# Patient Record
Sex: Female | Born: 1960 | Race: White | Hispanic: No | Marital: Married | Smoking: Former smoker
Health system: Southern US, Community
[De-identification: ages and names within clinical notes are randomized; demographics above are authoritative.]

## PROBLEM LIST (undated history)

## (undated) DIAGNOSIS — F419 Anxiety disorder, unspecified: Secondary | ICD-10-CM

## (undated) DIAGNOSIS — Z9889 Other specified postprocedural states: Secondary | ICD-10-CM

## (undated) DIAGNOSIS — R61 Generalized hyperhidrosis: Secondary | ICD-10-CM

## (undated) DIAGNOSIS — K559 Vascular disorder of intestine, unspecified: Secondary | ICD-10-CM

## (undated) DIAGNOSIS — K219 Gastro-esophageal reflux disease without esophagitis: Secondary | ICD-10-CM

## (undated) DIAGNOSIS — Z973 Presence of spectacles and contact lenses: Secondary | ICD-10-CM

## (undated) DIAGNOSIS — F32A Depression, unspecified: Secondary | ICD-10-CM

## (undated) DIAGNOSIS — R232 Flushing: Secondary | ICD-10-CM

## (undated) DIAGNOSIS — K529 Noninfective gastroenteritis and colitis, unspecified: Secondary | ICD-10-CM

## (undated) DIAGNOSIS — I10 Essential (primary) hypertension: Secondary | ICD-10-CM

## (undated) DIAGNOSIS — C50919 Malignant neoplasm of unspecified site of unspecified female breast: Secondary | ICD-10-CM

## (undated) DIAGNOSIS — K146 Glossodynia: Secondary | ICD-10-CM

## (undated) DIAGNOSIS — F329 Major depressive disorder, single episode, unspecified: Secondary | ICD-10-CM

## (undated) HISTORY — DX: Flushing: R23.2

## (undated) HISTORY — DX: Other specified postprocedural states: Z98.890

## (undated) HISTORY — DX: Malignant neoplasm of unspecified site of unspecified female breast: C50.919

## (undated) HISTORY — PX: COLON SURGERY: SHX602

## (undated) HISTORY — DX: Generalized hyperhidrosis: R61

## (undated) HISTORY — DX: Presence of spectacles and contact lenses: Z97.3

## (undated) HISTORY — PX: DIAGNOSTIC LAPAROSCOPY: SUR761

## (undated) HISTORY — DX: Depression, unspecified: F32.A

## (undated) HISTORY — PX: APPENDECTOMY: SHX54

## (undated) HISTORY — DX: Major depressive disorder, single episode, unspecified: F32.9

---

## 1998-07-25 ENCOUNTER — Encounter: Admission: RE | Admit: 1998-07-25 | Discharge: 1998-10-23 | Payer: Self-pay | Admitting: Family Medicine

## 1999-11-24 HISTORY — PX: BRAIN SURGERY: SHX531

## 2003-09-12 ENCOUNTER — Ambulatory Visit (HOSPITAL_COMMUNITY): Admission: RE | Admit: 2003-09-12 | Discharge: 2003-09-12 | Payer: Self-pay | Admitting: Gastroenterology

## 2007-06-24 ENCOUNTER — Emergency Department (HOSPITAL_COMMUNITY): Admission: EM | Admit: 2007-06-24 | Discharge: 2007-06-25 | Payer: Self-pay | Admitting: Emergency Medicine

## 2007-11-23 ENCOUNTER — Emergency Department (HOSPITAL_COMMUNITY): Admission: EM | Admit: 2007-11-23 | Discharge: 2007-11-23 | Payer: Self-pay | Admitting: Emergency Medicine

## 2007-11-24 HISTORY — PX: ABDOMINAL HYSTERECTOMY: SHX81

## 2009-01-21 DEATH — deceased

## 2009-03-27 ENCOUNTER — Ambulatory Visit (HOSPITAL_COMMUNITY): Admission: RE | Admit: 2009-03-27 | Discharge: 2009-03-27 | Payer: Self-pay | Admitting: Family Medicine

## 2010-02-24 ENCOUNTER — Emergency Department (HOSPITAL_COMMUNITY): Admission: EM | Admit: 2010-02-24 | Discharge: 2010-02-24 | Payer: Self-pay | Admitting: Emergency Medicine

## 2010-03-06 ENCOUNTER — Emergency Department (HOSPITAL_COMMUNITY): Admission: EM | Admit: 2010-03-06 | Discharge: 2010-03-06 | Payer: Self-pay | Admitting: Emergency Medicine

## 2010-08-08 ENCOUNTER — Ambulatory Visit (HOSPITAL_COMMUNITY)
Admission: RE | Admit: 2010-08-08 | Discharge: 2010-08-08 | Payer: Self-pay | Source: Home / Self Care | Admitting: Family Medicine

## 2010-11-23 DIAGNOSIS — Z9889 Other specified postprocedural states: Secondary | ICD-10-CM

## 2010-11-23 HISTORY — PX: CHOLECYSTECTOMY: SHX55

## 2010-11-23 HISTORY — DX: Other specified postprocedural states: Z98.890

## 2010-12-02 ENCOUNTER — Ambulatory Visit (HOSPITAL_COMMUNITY): Admission: RE | Admit: 2010-12-02 | Payer: Self-pay | Source: Home / Self Care | Admitting: Family Medicine

## 2010-12-14 ENCOUNTER — Encounter: Payer: Self-pay | Admitting: Obstetrics and Gynecology

## 2010-12-24 ENCOUNTER — Encounter: Payer: Self-pay | Admitting: Family Medicine

## 2011-02-02 ENCOUNTER — Inpatient Hospital Stay (HOSPITAL_COMMUNITY)
Admission: RE | Admit: 2011-02-02 | Discharge: 2011-02-05 | DRG: 395 | Disposition: A | Discharge status: Home / Self Care | Payer: Medicare (Managed Care) | Source: Ambulatory Visit | Attending: Family Medicine | Admitting: Family Medicine

## 2011-02-02 ENCOUNTER — Emergency Department (HOSPITAL_COMMUNITY): Payer: Medicare (Managed Care)

## 2011-02-02 DIAGNOSIS — K559 Vascular disorder of intestine, unspecified: Principal | ICD-10-CM | POA: Diagnosis present

## 2011-02-02 DIAGNOSIS — K5730 Diverticulosis of large intestine without perforation or abscess without bleeding: Secondary | ICD-10-CM | POA: Diagnosis present

## 2011-02-02 DIAGNOSIS — K146 Glossodynia: Secondary | ICD-10-CM | POA: Diagnosis present

## 2011-02-02 DIAGNOSIS — I10 Essential (primary) hypertension: Secondary | ICD-10-CM | POA: Diagnosis present

## 2011-02-02 DIAGNOSIS — Z8673 Personal history of transient ischemic attack (TIA), and cerebral infarction without residual deficits: Secondary | ICD-10-CM

## 2011-02-02 LAB — BASIC METABOLIC PANEL
BUN: 12 mg/dL (ref 6–23)
Calcium: 8.9 mg/dL (ref 8.4–10.5)
GFR calc non Af Amer: >60 mL/min (ref >60)
Glucose, Bld: 96 mg/dL (ref 70–99)
Sodium: 140 mEq/L (ref 135–145)

## 2011-02-02 LAB — CBC
MCV: 94.1 fL (ref 78.0–100.0)
Platelets: 235 10*3/uL (ref 150–400)
RBC: 4.24 MIL/uL (ref 3.87–5.11)
RDW: 13.3 % (ref 11.5–15.5)
WBC: 9.9 10*3/uL (ref 4.0–10.5)

## 2011-02-02 LAB — URINALYSIS, ROUTINE W REFLEX MICROSCOPIC
Bilirubin Urine: NEGATIVE
Ketones, ur: NEGATIVE mg/dL
Nitrite: NEGATIVE
Specific Gravity, Urine: >1.03 — ABNORMAL HIGH (ref 1.005–1.030)
Urobilinogen, UA: 0.2 mg/dL (ref 0.0–1.0)
pH: 5 (ref 5.0–8.0)

## 2011-02-02 LAB — HEPATIC FUNCTION PANEL
Bilirubin, Direct: 0.1 mg/dL (ref 0.0–0.3)
Indirect Bilirubin: 0.5 mg/dL (ref 0.3–0.9)
Total Protein: 6.1 g/dL (ref 6.0–8.3)

## 2011-02-02 LAB — DIFFERENTIAL
Basophils Relative: 0 % (ref 0–1)
Eosinophils Absolute: 0.2 10*3/uL (ref 0.0–0.7)
Eosinophils Relative: 2 % (ref 0–5)
Lymphs Abs: 2.2 10*3/uL (ref 0.7–4.0)
Neutrophils Relative %: 66 % (ref 43–77)

## 2011-02-03 ENCOUNTER — Inpatient Hospital Stay (HOSPITAL_COMMUNITY): Payer: Medicare (Managed Care)

## 2011-02-03 DIAGNOSIS — R112 Nausea with vomiting, unspecified: Secondary | ICD-10-CM

## 2011-02-03 DIAGNOSIS — K559 Vascular disorder of intestine, unspecified: Secondary | ICD-10-CM

## 2011-02-03 DIAGNOSIS — R109 Unspecified abdominal pain: Secondary | ICD-10-CM

## 2011-02-03 LAB — SEDIMENTATION RATE: Sed Rate: 15 mm/hr (ref 0–22)

## 2011-02-03 MED ORDER — IOHEXOL 350 MG/ML SOLN
100.0000 mL | Freq: Once | INTRAVENOUS | Status: AC | PRN
  Administered 2011-02-03: 100 mL via INTRAVENOUS

## 2011-02-04 LAB — HOMOCYSTEINE: Homocysteine: 6.7 umol/L (ref 4.0–15.4)

## 2011-02-04 LAB — LUPUS ANTICOAGULANT PANEL
DRVVT: 39.8 secs (ref 36.2–44.3)
Lupus Anticoagulant: NOT DETECTED

## 2011-02-04 LAB — CLOSTRIDIUM DIFFICILE BY PCR: Toxigenic C. Difficile by PCR: NEGATIVE

## 2011-02-05 LAB — OVA AND PARASITE EXAMINATION: Ova and parasites: NONE SEEN

## 2011-02-05 LAB — URINE CULTURE

## 2011-02-05 LAB — BETA-2-GLYCOPROTEIN I ABS, IGG/M/A
Beta-2 Glyco I IgG: 0 G Units (ref <20)
Beta-2-Glycoprotein I IgM: 10 M Units (ref <20)

## 2011-02-05 LAB — CBC
MCV: 92.8 fL (ref 78.0–100.0)
Platelets: 226 10*3/uL (ref 150–400)
RDW: 12.8 % (ref 11.5–15.5)
WBC: 5.3 10*3/uL (ref 4.0–10.5)

## 2011-02-05 LAB — CARDIOLIPIN ANTIBODIES, IGG, IGM, IGA: Anticardiolipin IgM: 4 MPL U/mL — ABNORMAL LOW (ref <11)

## 2011-02-05 LAB — DIFFERENTIAL
Basophils Absolute: 0 10*3/uL (ref 0.0–0.1)
Basophils Relative: 0 % (ref 0–1)
Eosinophils Absolute: 0.2 10*3/uL (ref 0.0–0.7)
Eosinophils Relative: 5 % (ref 0–5)

## 2011-02-05 LAB — ANA: Anti Nuclear Antibody(ANA): NEGATIVE

## 2011-02-05 NOTE — Progress Notes (Signed)
  NAME:  AZARIYAH, LUHRS NO.:  [id number]  MEDICAL RECORD NO.:  [id number]          PATIENT TYPE:  LOCATION:                                 FACILITY:  PHYSICIAN:  Kyannah Climer G. Renard Matter, MD        DATE OF BIRTH:  DATE OF PROCEDURE:  02/03/2011 DATE OF DISCHARGE:                                PROGRESS NOTE   This patient was admitted with lower abdominal type pain and rectal bleeding, which occurred within a 24-hour period prior to admission. Does have a history of ischemic colitis.  OBJECTIVE:  VITAL SIGNS:  Blood pressure 98/58, respirations 18, pulse 60, temperature 97.3. LUNGS:  Clear to P and A. HEART:  Regular rhythm. ABDOMEN:  Slight tenderness over lower abdomen.  ASSESSMENT:  The patient was admitted with bouts of bloody diarrhea. Does have a history of ischemic colitis.  Plan to continue current regimen.  Continue to monitor hemoglobin/hematocrit.  We will obtain GI consult.     Tiffine Henigan G. Renard Matter, MD     AGM/MEDQ  D:  02/03/2011  T:  02/03/2011  Job:  [id number]  Electronically Signed by Butch Penny MD on 02/05/2011 04:45:31 PM

## 2011-02-05 NOTE — H&P (Signed)
  NAME:  Angela Boyer, Angela Boyer NO.:  [id number]  MEDICAL RECORD NO.:  [id number]           PATIENT TYPE:  LOCATION:                                 FACILITY:  PHYSICIAN:  Brianna Bennett G. Renard Matter, MD   DATE OF BIRTH:  [birth year]  DATE OF ADMISSION: DATE OF DISCHARGE:  LH                             HISTORY & PHYSICAL   This 50 year old white female was admitted through the emergency department with a chief complaint of having lower abdominal pain which began at approximately 3 a.m. on day of admission.  Apparently, she has a history of ischemic colitis and previous episodes of abdominal pain. She had taken Amitiza, dicyclomine, had several bloody stools and abdominal pain.  She was seen and evaluated by ED physician.  A CT of the abdomen was obtained which showed diffuse wall thickening involving the descending and proximal sigmoid colon compatible with colitis and/or ischemic colitis, did have mild diverticulosis in distal sigmoid colon. The patient's laboratory data, WBC 9900 with hemoglobin 13.5, hematocrit 39.9.  BMET was essentially within normal range as well as liver function.  The patient was subsequently admitted for further care.  SOCIAL HISTORY:  The patient does not smoke or drink alcohol or use drugs.  FAMILY HISTORY:  Hypertension.  PAST MEDICAL HISTORY: 1. Prior history of hypertension. 2. Cerebral aneurysm. 3. Ischemic colitis. 4. Burning tongue syndrome.  SURGICAL HISTORY: 1. Appendectomy. 2. Hysterectomy.  ALLERGIES:  VICODIN.  MEDICATION LIST: 1. Amitiza 24 mcg p.r.n. 2. Dicyclomine 10 mg p.r.n. 3. Omeprazole 40 mg b.i.d. 4. Clonazepam 0.5 mg b.i.d. 5. Ativan 0.5 mg p.r.n. 6. Amlodipine 10 mg daily. 7. Benazepril 20 mg daily. 8. Percocet p.r.n. 9. Gabapentin 600 mg 2 tablets 3 times a day.  REVIEW OF SYSTEMS:  HEENT: Negative.  CARDIOPULMONARY:  No cough, hemoptysis, dyspnea.  GI:  Episodes of abdominal pain and blood in stool.  GU:  No  dysuria or hematuria.  PHYSICAL EXAMINATION:  GENERAL:  Alert white female. VITAL SIGNS:  Blood pressure is 98/58, respirations 18, pulse 60, temperature 97.3. HEENT:  Eyes are PERRLA.  TM negative.  Oropharynx benign. NECK:  Supple.  No JVD or thyroid abnormalities. HEART:  Regular rhythm.  No murmurs. LUNGS:  Clear to P and A. ABDOMEN:  No palpable organs or masses.  The patient is tender over the left lower and right lower quadrants. NEUROLOGIC:  No focal deficit.  ASSESSMENT:  The patient was admitted with lower abdominal pain and rectal bleeding.  She does have a prior history of ischemic colitis, hypertension, burning tongue syndrome.  Dictation ended at this point.     Sakia Schrimpf G. Renard Matter, MD     AGM/MEDQ  D:  02/03/2011  T:  02/03/2011  Job:  [id number]  Electronically Signed by Butch Penny MD on 02/05/2011 04:45:38 PM

## 2011-02-05 NOTE — Progress Notes (Signed)
  NAME:  Angela Boyer, Angela Boyer NO.:  [id number]  MEDICAL RECORD NO.:  [id number]          PATIENT TYPE:  LOCATION:                                 FACILITY:  PHYSICIAN:  Tyrena Gohr G. Renard Matter, MD        DATE OF BIRTH:  DATE OF PROCEDURE: DATE OF DISCHARGE:                                PROGRESS NOTE   This patient was admitted with the chief complaint being lower abdominal pain, which began early on the day of admission.  She does have a history of ischemic colitis in the past.  CT of the abdomen showed diffuse wall thickening involving descending, proximal sigmoid colon. CT angio showed minimal aortic atherosclerosis, patent mesenteric and renal vasculature, colitis left colon and proximal sigmoid colon.  OBJECTIVE:  VITAL SIGNS:  Blood pressure 95/61, respirations 20, pulse 62, temperature 98.1. LUNGS:  Clear to P and A. HEART:  Regular rhythm. ABDOMEN:  The patient is tender over lower abdomen.  ASSESSMENT:  The patient was admitted with abdominal pain and rectal bleeding.  She does have a prior history of ischemic colitis and this appears to be similar episode.  She does have hypertension and burning tongue syndrome.  The patient is being seen by Gastroenterology Service. Various studies have been ordered.  Routine stool studies, hypercoagulable panel ordered.  Continue current regimen.     [name] G. Renard Matter, MD     AGM/MEDQ  D:  02/04/2011  T:  02/04/2011  Job:  [id number]  Electronically Signed by Butch Penny MD on 02/05/2011 04:45:41 PM

## 2011-02-06 MED ORDER — IOHEXOL 350 MG/ML SOLN
100.0000 mL | Freq: Once | INTRAVENOUS | Status: AC | PRN
  Administered 2011-02-06: 100 mL via INTRAVENOUS

## 2011-02-07 LAB — PROTEIN S, TOTAL: Protein S Ag, Total: 91 % (ref 70–140)

## 2011-02-07 LAB — PROTEIN C, TOTAL: Protein C, Total: 105 % (ref 70–140)

## 2011-02-07 NOTE — Discharge Summary (Signed)
NAME:  Angela Boyer, Angela Boyer            ACCOUNT NO.:  [id number]  MEDICAL RECORD NO.:  [id number]           PATIENT TYPE:  I  LOCATION:  A207                          FACILITY:  [facility]  PHYSICIAN:  Avril Busser G. Renard Matter, MD   DATE OF BIRTH:  Oct 23, 1961  DATE OF ADMISSION:  02/03/2011 DATE OF DISCHARGE:  03/15/2012LH                              DISCHARGE SUMMARY   DIAGNOSES:  Ischemic colitis, hypertension, burning tongue syndrome, past medical history of hypertension, cerebral aneurysm.  The patient's condition is stable at the time of discharge.  A 50 year old white female was admitted to the emergency department with chief complaint being lower abdominal pain which began approximately 3 a.m. on the day of admission.  She has a history of ischemic colitis and previous episodes of abdominal pain.  She had taken Amitiza and dicyclomine and had several bloody loose stools and abdominal pain.  She was seen and evaluated by ED physician.  A CT of the abdomen was obtained which showed diffuse wall thickening involving the descending and proximal sigmoid colon compatible with colitis and/or ischemic colitis.  The patient did have mild diverticulosis noted in the distal sigmoid colon.  The patient's laboratory data showed WBC of 9900 with hemoglobin 13.5 and hematocrit 39.9.  BMET was essential within normal range as well as liver function.  The patient was admitted for further care.  PHYSICAL EXAMINATION ON ADMISSION:  VITAL SIGNS:  Blood pressure 95/61, respirations 20, pulse 62, and temp 98.1. HEENT:  Eyes, PERRLA.  TMs negative.  Oropharynx benign. NECK:  Supple.  No JVD or thyroid abnormalities. HEART:  Regular rate and rhythm.  No murmurs. LUNGS:  Clear to P and A. ABDOMEN:  No palpable organs or masses.  The patient was tender over the left lower quadrant of the abdomen. NEUROLOGIC:  No focal deficit.  LABORATORY DATA:  CBC on admission:  WBC 9900 with hemoglobin 13.5 and hematocrit  39.9.  UA essentially negative.  BMP:  Sodium 140, potassium 3.5, chloride 106, CO2 of 27, glucose 96, BUN 12, and creatinine 0.91. Hepatic function all within normal limits.  Lipase 20.  ESR 15. Clostridium difficile essentially negative.  Subsequent CBC on Apr 07, 2011:  WBC 5300 with hemoglobin 12.3, hematocrit 36.2.  ANA negative. Urine culture, no growth. O and P negative.  Hypercoagulable panel:  See previous record.  Protein C functional 141.  All other tests apparently within normal range.  X-RAYS:  CT of the abdomen on admission showed diffuse wall thickening involving the descending proximal sigmoid colon compatible with colitis, ischemic colitis was a strong possibility, mild diverticulosis, scattered calcification along the abdominal aorta.  CT angio of the abdomen and pelvis, impression, minimal aortic atherosclerosis without occlusive process noted, patent mesenteric renal vasculature, colitis of the left colon and proximal sigmoid colon as previously described.  HOSPITAL COURSE:  The patient at the time of her admission was placed on IV normal saline at 75 mL an hour.  Vital signs were monitored.  She was placed on clear liquid diet.  Hydrocodone/APAP 5/325 was given 1-2 tablets every 4 hours p.r.n. for pain and subsequently Dilaudid 2 mg IV  was given p.r.n. for severe pain.  She was continued on dicyclomine 10 mg q.i.d., omeprazole 40 mg b.i.d., clonazepam 0.5 mg b.i.d., amlodipine 10 mg daily, benazepril 20 mg daily, and gabapentin 600 mg t.i.d.  The patient slowly and gradually improved throughout her hospital stay, became less symptomatic.  She was seen in consultation by GI Service who felt that initially she had thickening of descending and proximal sigmoid colon, changes suspicious for ischemic colitis.  Had 2 similar episodes in the past documented in the note in the hospital.  Thought perhaps she had a potential trigger of aspirin.  Dr. Karilyn Cota talked with Dr.  Carman Ching in [place], gastroenterologist.  It was felt she had not had any study looking for clotting disorder or vasculitis.  The patient did have CT angio of the abdomen ordered by Gastroenterology which did not show any vascular occlusive process.  Her diet was gradually increased.  She did have several loose stools on the day of discharge.  Dr. Karilyn Cota felt she would be able to go home and labs will be followed as an outpatient.  The patient would be seen by him again as an outpatient as well.  She was stable at the time of discharge and she was discharged on following medications: 1. Ambien 10 mg daily at bedtime as needed. 2. Amlodipine 10 mg daily. 3. Ativan 0.5 mg every 8 hours p.r.n. 4. Benazepril 20 mg daily. 5. Clonazepam 0.5 mg daily. 6. Dicyclomine 10 mg as needed. 7. Gabapentin 600 mg t.i.d. 8. Gas-X OTC 1-2 tablets by mouth every 8 hours as needed. 9. Amitiza 24 mcg b.i.d. 10.Nexium 40 mg daily. 11.Percocet 5/325 one every 6 hours as needed. 12.Imodium after each loose stool.  The patient was instructed to return to Dr. Renard Matter' office as well as Dr. Patty Sermons for followup.     Mariza Bourget G. Renard Matter, MD    AGM/MEDQ  D:  02/05/2011  T:  02/06/2011  Job:  [id number]  Electronically Signed by Butch Penny MD on 02/07/2011 09:18:27 AM

## 2011-02-08 LAB — STOOL CULTURE

## 2011-02-10 LAB — CRYOGLOBULIN

## 2011-02-23 ENCOUNTER — Ambulatory Visit (INDEPENDENT_AMBULATORY_CARE_PROVIDER_SITE_OTHER): Payer: Medicare (Managed Care) | Admitting: Internal Medicine

## 2011-02-24 NOTE — Consult Note (Signed)
NAME:  Angela Boyer, HOCHSTEIN NO.:  [id number]  MEDICAL RECORD NO.:  [id number]           PATIENT TYPE:  I  LOCATION:  A207                          FACILITY:  [facility]  PHYSICIAN:  Lionel December, M.D.    DATE OF BIRTH:  [birth year]  DATE OF CONSULTATION:  02/03/2011 DATE OF DISCHARGE:                                CONSULTATION   REASON FOR CONSULTATION:  Acute colitis, felt to be ischemic colitis. This is the third episode.  HISTORY OF PRESENT ILLNESS:  Angela Boyer is a 50 year old Caucasian female with multiple medical problems who was in usual state of health until yesterday morning when she woke up with pain across her lower abdomen and felt like she need to have a bowel movement.  She did use a Fleet enema and passed bits and pieces of stools.  Then she began to pass fresh blood per rectum.  Her bleeding continued.  She came to emergency room last evening.  She had abdominopelvic CT which showed thickening primarily to left colonic wall.  She was felt to have acute colitis most likely ischemic given the location and she was admitted to Dr. Renard Matter' service.  Since she has been in her room she has not had any more bowel movements, however, she has noticed some blood when she urinates.  Her pain is mainly across the lower abdomen but yesterday it radiated into her left upper quadrant.  The patient states that she had 5 to 7 bowel movements yesterday.  REVIEW OF SYSTEMS:  Negative for fever, nausea, or vomiting.  She does take BC or Goody powder  5 to 6 times a week.  She does not take any other OTC and NSAIDs or herbal medication.  The patient states that she has had similar episodes in the past.  First episode occurred in November 2010 and the second episode was in June 2011 and both time she was admitted to [facility] in [place], [place].  She had colonoscopy on both of these occasions and diagnosed with ischemic colitis.  CURRENT MEDICATIONS: 1.  Amlodipine 10 mg p.o. daily. 2. Benazepril 20 mg p.o. daily. 3. Clonazepam 0.5 mg b.i.d. 4. Dicyclomine 10 mg p.o. q.i.d. 5. Gabapentin 600 mg p.o. t.i.d. 6. Dilaudid 2 mg IV q.4 p.r.n. 7. Percocet 1 p.o. q.4 p.r.n. 8. Pantoprazole 80 mg p.o. b.i.d.  At home, she is on: 1. Percocet 1-3 tablets per day. 2. Neurontin 600 mg p.o. b.i.d. 3. Norvasc 10 mg daily. 4. Benazepril 20 mg daily. 5. Nexium 40 mg p.o. q.a.m. 6. Ambien 10 mg p.o. at bedtime p.r.n. 7. Clonazepam 0.5 mg daily which is generally in the afternoons. 8. Fleet enema p.r.n. 9. Gas-X p.r.n.  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  Few years later, she developed small bowel obstruction secondary to adhesions and had segment resected.  She has history of intracranial aneurysm.  She sates one ruptured but both of these were treated.  She presented with left-sided blindness and paraplegia.  She has gradually recovered except she still has memory impairments.  She has had GERD for10 years and hypertension for about 8 years.  She has burning tongue syndrome.  She has been to couple of tertiary centers but without definite treatment or success.  Presently, she goes to pain management clinic in [place], [place] and she also is seeing a physician at [facility].  She is advised to increase her Neurontin dose but elected not to do that.  History of colonic polyp.  She had single polyp removed by Dr. Carman Ching of [facility] in [place] in 2007. History of ischemic colitis with colonoscopy in November 2010 and June 2011 as above.  She had hysterectomy about 3 years ago at [facility].  ALLERGIES:  She is not allergic to any medication.  She gets GI upset with HYDROCODONE.  FAMILY HISTORY:  Mother at 27 is doing well.  She has had 30 colonic polyps removed.  Father is 51 with diabetes mellitus, doing fairly  well. She has one brother who is in good health.  He has never had a colonoscopy.  SOCIAL HISTORY:  She is married.  She has a daughter, aged 15, in good health.  She presently is not working.  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  She smoked half a pack of cigarettes per day for 10 years but quit 15 years ago.  She does not drink alcohol.  She states she does get some secondhand exposure to smoke on weekends when she is working with her husband.  She has recently joined [employer] trying to do more exercise and swimming.  OBJECTIVE:  VITAL SIGNS:  Admission weight 68 kg, she is 62 inches tall, pulse 62 per minute, blood pressure 100/60, temperature is 97.7, and respirations 18. HEENT:  Conjunctivae are pink.  Sclerae are nonicteric.  Oropharyngeal mucosa is normal. NECK:  No neck masses or thyromegaly noted. CARDIAC:  Regular rhythm.  Normal S1 and S2.  No murmur or gallop noted. LUNGS:  Clear to auscultation. ABDOMEN:  Full.  Bowel sounds are normal.  No bruits noted.  On palpation, soft abdomen with mild tenderness in left lower quadrant.  No organomegaly or masses noted. RECTAL:  Deferred. EXTREMITIES:  No peripheral edema or clubbing noted.  LABORATORY DATA:  From admission, WBC 9.9, H and H 13.5 and 39.9, and platelet count 235,000 and diff is normal.  Serum sodium 140, potassium 3.5, chloride 106, CO2 of 27, glucose 96, BUN 12, creatinine 0.91, and calcium 8.9.  Total bilirubin 0.6, AP 62, SGOT 29, SGPT 25, total protein 6.1 with albumin of 3.3.  Abdominopelvic CT reviewed.  This study was performed early this morning, performed with contrast and shows diffuse wall thickening involving descending colon and proximal segment of sigmoid colon.  No pneumoperitoneum noted.  There was some soft tissue stranding surrounding the descending colon.  She had few scattered calcifications involving the aorta  and its branches.  Both the celiac trunk and SMA appear to be patent though.  She also has thickening to distal esophagus.  ASSESSMENT:  Angela Boyer is a 50 year old Caucasian female who presents with more or less sudden onset of left-sided abdominal pain, increased frequency of bowel movements, and rectal bleeding.  She has thickening to descending colon and proximal sigmoid colon.  These changes are very suspicious for ischemic colitis.  She has had 2 similar episodes in the past well documented at another hospital.  She does not appear to be acutely  ill.  She does not have leukocytosis, tachycardia, or fever. Therefore, I do not believe she needs to be on antibiotic therapy.  The patient does not smoke cigarettes.  She does not take any medications which have vasoconstrictor properties.  Perhaps only potential trigger would be aspirin by virtue of prostaglandin pathway. I doubt any of her other medications is the reason for ischemic colitis. Therefore, we do not know the reason for her to have recurrent ischemic colitis.  I talked with Dr. Carman Ching who is the gastroenterologist in [place].  She has not had any studies looking for clotting disorder or vasculitis.  Therefore, it will be reasonable to pursue with further workup given this is the third episode of ischemic colitis.  RECOMMENDATIONS: 1. We will proceed with routine stool studies. 2. CT angio of abdomen and pelvis. 3. Sed rate, ANA.  I will also request hypercoagulable panel which     includes antithrombin III, protein C, protein S, factor V Leiden,     prothrombin gene mutation, cardiolipin, and lupus anticoagulants.  I do not feel that there is any need or indication for colonoscopy.  We appreciate the opportunity to participate in the care of this nice lady.     Lionel December, M.D.     NR/MEDQ  D:  02/03/2011  T:  02/04/2011  Job:  [id number]  cc:   Fayrene Fearing L. Malon Kindle., M.D. Fax:  [phone]  Electronically Signed by Lionel December M.D. on 02/24/2011 10:37:23 AM

## 2011-04-10 NOTE — Op Note (Signed)
   NAME:  Angela Boyer, Angela Boyer                      ACCOUNT NO.:  [id number]   MEDICAL RECORD NO.:  [id number]                   PATIENT TYPE:  AMB   LOCATION:  ENDO                                 [facility]ACILITY[facility]   PHYSICIAN:  James L. Malon Kindle., M.D.          DATE OF BIRTH:  Oct 09, 1961   DATE OF PROCEDURE:  09/12/2003  DATE OF DISCHARGE:                                 OPERATIVE REPORT   PROCEDURE:  Esophagogastroduodenoscopy.   MEDICATIONS:  Cetacaine spray, Versed 10 mg IV.   INDICATIONS:  Persistent esophageal reflux.   DESCRIPTION OF PROCEDURE:  The procedure had been explained to the patient  and consent obtained.  With the patient in the left lateral decubitus  position, the Olympus scope was inserted and advanced.  The stomach was  entered, pylorus identified and passed.  The duodenum including the bulb and  second portion was seen well.  The scope was withdrawn back into the stomach  and the antrum was carefully examined and was normal.  The scope was  withdrawn.  The diaphragmatic hiatus was located at 40 cm.  There was a  widely patent GE junction.  The distal esophagus was somewhat reddened.  The  scope was withdrawn.  The esophagus was reddened on the distal part but the  proximal esophagus was normal.  The scope was withdrawn, and the patient  tolerated the procedure well.   ASSESSMENT:  Esophageal reflux.   PLAN:  Will keep the patient on current medications, give antireflux  instructions, and see back in the office in two months.                                               James L. Malon Kindle., M.D.    Waldron Session  D:  09/12/2003  T:  09/12/2003  Job:  [id number]   cc:   Angus G. Renard Matter, M.D.  [address]  [place]  [place] [address]  Fax: [phone]

## 2011-05-08 ENCOUNTER — Other Ambulatory Visit (HOSPITAL_COMMUNITY): Payer: Self-pay | Admitting: Family Medicine

## 2011-05-11 ENCOUNTER — Ambulatory Visit (HOSPITAL_COMMUNITY)
Admission: RE | Admit: 2011-05-11 | Discharge: 2011-05-11 | Disposition: A | Discharge status: Home / Self Care | Payer: Medicare (Managed Care) | Source: Ambulatory Visit | Attending: Family Medicine | Admitting: Family Medicine

## 2011-05-11 DIAGNOSIS — Z87898 Personal history of other specified conditions: Secondary | ICD-10-CM | POA: Insufficient documentation

## 2011-05-11 DIAGNOSIS — J329 Chronic sinusitis, unspecified: Secondary | ICD-10-CM | POA: Insufficient documentation

## 2011-05-11 DIAGNOSIS — R51 Headache: Secondary | ICD-10-CM | POA: Insufficient documentation

## 2011-05-11 MED ORDER — GADOBENATE DIMEGLUMINE 529 MG/ML IV SOLN: 15.0000 mL | Freq: Once | INTRAVENOUS | Status: AC | PRN

## 2011-06-11 ENCOUNTER — Ambulatory Visit (INDEPENDENT_AMBULATORY_CARE_PROVIDER_SITE_OTHER): Payer: Medicare (Managed Care) | Admitting: Internal Medicine

## 2011-08-29 ENCOUNTER — Emergency Department (HOSPITAL_COMMUNITY): Payer: Medicare (Managed Care)

## 2011-08-29 ENCOUNTER — Inpatient Hospital Stay (HOSPITAL_COMMUNITY)
Admission: EM | Admit: 2011-08-29 | Discharge: 2011-09-04 | DRG: 392 | Disposition: A | Discharge status: Home / Self Care | Payer: Medicare (Managed Care) | Attending: Family Medicine | Admitting: Family Medicine

## 2011-08-29 ENCOUNTER — Encounter: Payer: Self-pay | Admitting: Emergency Medicine

## 2011-08-29 DIAGNOSIS — K5289 Other specified noninfective gastroenteritis and colitis: Principal | ICD-10-CM | POA: Diagnosis present

## 2011-08-29 DIAGNOSIS — K8020 Calculus of gallbladder without cholecystitis without obstruction: Secondary | ICD-10-CM | POA: Diagnosis present

## 2011-08-29 DIAGNOSIS — I10 Essential (primary) hypertension: Secondary | ICD-10-CM | POA: Diagnosis present

## 2011-08-29 DIAGNOSIS — L290 Pruritus ani: Secondary | ICD-10-CM

## 2011-08-29 DIAGNOSIS — K759 Inflammatory liver disease, unspecified: Secondary | ICD-10-CM

## 2011-08-29 DIAGNOSIS — K2100 Gastro-esophageal reflux disease with esophagitis, without bleeding: Secondary | ICD-10-CM | POA: Diagnosis present

## 2011-08-29 DIAGNOSIS — K146 Glossodynia: Secondary | ICD-10-CM | POA: Diagnosis present

## 2011-08-29 DIAGNOSIS — R197 Diarrhea, unspecified: Secondary | ICD-10-CM

## 2011-08-29 DIAGNOSIS — K529 Noninfective gastroenteritis and colitis, unspecified: Secondary | ICD-10-CM

## 2011-08-29 DIAGNOSIS — T368X5A Adverse effect of other systemic antibiotics, initial encounter: Secondary | ICD-10-CM | POA: Diagnosis present

## 2011-08-29 DIAGNOSIS — G4700 Insomnia, unspecified: Secondary | ICD-10-CM | POA: Diagnosis present

## 2011-08-29 HISTORY — DX: Noninfective gastroenteritis and colitis, unspecified: K52.9

## 2011-08-29 HISTORY — DX: Vascular disorder of intestine, unspecified: K55.9

## 2011-08-29 HISTORY — DX: Gastro-esophageal reflux disease without esophagitis: K21.9

## 2011-08-29 HISTORY — DX: Essential (primary) hypertension: I10

## 2011-08-29 HISTORY — DX: Anxiety disorder, unspecified: F41.9

## 2011-08-29 LAB — LIPASE, BLOOD: Lipase: 21 U/L (ref 11–59)

## 2011-08-29 LAB — URINALYSIS, ROUTINE W REFLEX MICROSCOPIC
Bilirubin Urine: NEGATIVE
Glucose, UA: NEGATIVE mg/dL
Hgb urine dipstick: NEGATIVE
Protein, ur: NEGATIVE mg/dL
Urobilinogen, UA: 0.2 mg/dL (ref 0.0–1.0)

## 2011-08-29 LAB — CBC
Hemoglobin: 14.8 g/dL (ref 12.0–15.0)
MCH: 31.6 pg (ref 26.0–34.0)
Platelets: 227 10*3/uL (ref 150–400)
RBC: 4.68 MIL/uL (ref 3.87–5.11)
WBC: 8.1 10*3/uL (ref 4.0–10.5)

## 2011-08-29 LAB — COMPREHENSIVE METABOLIC PANEL
ALT: 230 U/L — ABNORMAL HIGH (ref 0–35)
Alkaline Phosphatase: 93 U/L (ref 39–117)
BUN: 9 mg/dL (ref 6–23)
CO2: 31 mEq/L (ref 19–32)
Chloride: 102 mEq/L (ref 96–112)
GFR calc Af Amer: >90 mL/min (ref >90)
Glucose, Bld: 89 mg/dL (ref 70–99)
Potassium: 4.1 mEq/L (ref 3.5–5.1)
Sodium: 141 mEq/L (ref 135–145)
Total Bilirubin: 0.5 mg/dL (ref 0.3–1.2)

## 2011-08-29 LAB — DIFFERENTIAL
Lymphocytes Relative: 30 % (ref 12–46)
Lymphs Abs: 2.4 10*3/uL (ref 0.7–4.0)
Monocytes Relative: 10 % (ref 3–12)
Neutro Abs: 4.7 10*3/uL (ref 1.7–7.7)
Neutrophils Relative %: 58 % (ref 43–77)

## 2011-08-29 MED ORDER — ZOLPIDEM TARTRATE 5 MG PO TABS
10.0000 mg | ORAL_TABLET | Freq: Every day | ORAL | Status: DC
  Administered 2011-08-29 – 2011-09-03 (×6): 10 mg via ORAL
  Filled 2011-08-29 (×2): qty 2
  Filled 2011-08-29: qty 1
  Filled 2011-08-29: qty 2
  Filled 2011-08-29: qty 1
  Filled 2011-08-29 (×2): qty 2

## 2011-08-29 MED ORDER — FAMOTIDINE IN NACL 20-0.9 MG/50ML-% IV SOLN
20.0000 mg | Freq: Once | INTRAVENOUS | Status: AC
  Administered 2011-08-29: 20 mg via INTRAVENOUS
  Filled 2011-08-29: qty 50

## 2011-08-29 MED ORDER — IOHEXOL 300 MG/ML  SOLN
100.0000 mL | Freq: Once | INTRAMUSCULAR | Status: AC | PRN
  Administered 2011-08-29: 100 mL via INTRAVENOUS

## 2011-08-29 MED ORDER — METRONIDAZOLE 500 MG PO TABS: 500.0000 mg | ORAL_TABLET | Freq: Three times a day (TID) | ORAL | Status: DC

## 2011-08-29 MED ORDER — HYDROMORPHONE HCL 1 MG/ML IJ SOLN
2.0000 mg | INTRAMUSCULAR | Status: DC | PRN
  Administered 2011-08-29 – 2011-09-02 (×12): 2 mg via INTRAVENOUS
  Filled 2011-08-29 (×12): qty 2

## 2011-08-29 MED ORDER — ACETAMINOPHEN 500 MG PO TABS: 500.0000 mg | ORAL_TABLET | ORAL | Status: DC | PRN

## 2011-08-29 MED ORDER — ONDANSETRON HCL 4 MG/2ML IJ SOLN
4.0000 mg | Freq: Once | INTRAMUSCULAR | Status: AC
  Administered 2011-08-29: 4 mg via INTRAVENOUS
  Filled 2011-08-29: qty 2

## 2011-08-29 MED ORDER — BENAZEPRIL HCL 10 MG PO TABS
20.0000 mg | ORAL_TABLET | Freq: Every day | ORAL | Status: DC
  Administered 2011-08-30 – 2011-09-03 (×5): 20 mg via ORAL
  Filled 2011-08-29 (×6): qty 2

## 2011-08-29 MED ORDER — OXYCODONE-ACETAMINOPHEN 5-325 MG PO TABS
1.0000 | ORAL_TABLET | Freq: Three times a day (TID) | ORAL | Status: DC | PRN
  Administered 2011-08-29 – 2011-09-03 (×6): 1 via ORAL
  Filled 2011-08-29 (×7): qty 1

## 2011-08-29 MED ORDER — AMLODIPINE BESYLATE 5 MG PO TABS
10.0000 mg | ORAL_TABLET | Freq: Every day | ORAL | Status: DC
  Administered 2011-08-30 – 2011-09-03 (×5): 10 mg via ORAL
  Filled 2011-08-29 (×6): qty 2

## 2011-08-29 MED ORDER — PANTOPRAZOLE SODIUM 40 MG PO TBEC
40.0000 mg | DELAYED_RELEASE_TABLET | Freq: Every day | ORAL | Status: DC
  Filled 2011-08-29: qty 1

## 2011-08-29 MED ORDER — CIPROFLOXACIN HCL 250 MG PO TABS
500.0000 mg | ORAL_TABLET | Freq: Two times a day (BID) | ORAL | Status: DC
  Administered 2011-08-30: 500 mg via ORAL
  Filled 2011-08-29: qty 2

## 2011-08-29 MED ORDER — METRONIDAZOLE IN NACL 5-0.79 MG/ML-% IV SOLN
500.0000 mg | Freq: Once | INTRAVENOUS | Status: AC
  Administered 2011-08-29: 500 mg via INTRAVENOUS
  Filled 2011-08-29: qty 100

## 2011-08-29 MED ORDER — ZOLPIDEM TARTRATE 5 MG PO TABS: 10.0000 mg | ORAL_TABLET | Freq: Every day | ORAL | Status: DC

## 2011-08-29 MED ORDER — GABAPENTIN 600 MG PO TABS
600.0000 mg | ORAL_TABLET | Freq: Two times a day (BID) | ORAL | Status: DC
  Filled 2011-08-29 (×4): qty 1

## 2011-08-29 MED ORDER — HYDROMORPHONE HCL 1 MG/ML IJ SOLN
1.0000 mg | Freq: Once | INTRAMUSCULAR | Status: AC
  Administered 2011-08-29: 1 mg via INTRAVENOUS
  Filled 2011-08-29: qty 1

## 2011-08-29 MED ORDER — CIPROFLOXACIN IN D5W 400 MG/200ML IV SOLN
400.0000 mg | Freq: Two times a day (BID) | INTRAVENOUS | Status: DC
  Administered 2011-08-29: 400 mg via INTRAVENOUS
  Filled 2011-08-29 (×5): qty 200

## 2011-08-29 MED ORDER — CLONAZEPAM 0.5 MG PO TABS
0.5000 mg | ORAL_TABLET | Freq: Two times a day (BID) | ORAL | Status: DC
  Filled 2011-08-29: qty 1

## 2011-08-29 MED ORDER — SODIUM CHLORIDE 0.9 % IV BOLUS (SEPSIS)
1000.0000 mL | Freq: Once | INTRAVENOUS | Status: AC
  Administered 2011-08-29: 1000 mL via INTRAVENOUS

## 2011-08-29 MED ORDER — GABAPENTIN 300 MG PO CAPS
600.0000 mg | ORAL_CAPSULE | Freq: Two times a day (BID) | ORAL | Status: DC
  Administered 2011-08-29 – 2011-09-03 (×11): 600 mg via ORAL
  Filled 2011-08-29 (×5): qty 2
  Filled 2011-08-29: qty 1
  Filled 2011-08-29 (×3): qty 2
  Filled 2011-08-29: qty 1
  Filled 2011-08-29 (×2): qty 2

## 2011-08-29 MED ORDER — ONDANSETRON HCL 4 MG/2ML IJ SOLN
4.0000 mg | Freq: Four times a day (QID) | INTRAMUSCULAR | Status: DC | PRN
  Administered 2011-08-29 – 2011-09-02 (×4): 4 mg via INTRAVENOUS
  Filled 2011-08-29 (×5): qty 2

## 2011-08-29 MED ORDER — SODIUM CHLORIDE 0.9 % IV SOLN
INTRAVENOUS | Status: DC
  Administered 2011-08-29 – 2011-09-01 (×4): via INTRAVENOUS
  Administered 2011-09-02 (×2): 1000 mL via INTRAVENOUS
  Administered 2011-09-03: 19:00:00 via INTRAVENOUS

## 2011-08-29 MED ORDER — METRONIDAZOLE 500 MG PO TABS
500.0000 mg | ORAL_TABLET | Freq: Three times a day (TID) | ORAL | Status: DC
  Administered 2011-08-29 – 2011-08-30 (×2): 500 mg via ORAL
  Filled 2011-08-29 (×2): qty 1

## 2011-08-29 MED ORDER — CLONAZEPAM 0.5 MG PO TABS
0.5000 mg | ORAL_TABLET | Freq: Every day | ORAL | Status: DC
  Administered 2011-08-29 – 2011-09-03 (×6): 0.5 mg via ORAL
  Filled 2011-08-29 (×5): qty 1

## 2011-08-29 NOTE — ED Provider Notes (Signed)
History     CSN: [id number] Arrival date & time: 08/29/2011 12:38 PM  Chief Complaint  Patient presents with  . Abdominal Pain    (Consider location/radiation/quality/duration/timing/severity/associated sxs/prior treatment) Patient is a 50 y.o. female presenting with abdominal pain. The history is provided by the patient. No language interpreter was used.  Abdominal Pain The primary symptoms of the illness include abdominal pain, nausea and diarrhea. The primary symptoms of the illness do not include fever, fatigue, shortness of breath, vomiting, hematemesis, hematochezia, dysuria, vaginal discharge or vaginal bleeding. Primary symptoms comment: s/p partial hysterectomy The current episode started more than 2 days ago (1 week ago). The onset of the illness was gradual. The problem has been gradually worsening.  The abdominal pain began more than 2 days ago. The pain came on gradually. The abdominal pain has been gradually worsening since its onset. The abdominal pain is located in the suprapubic region, RLQ and LLQ. The abdominal pain does not radiate. The severity of the abdominal pain is 10/10. The abdominal pain is relieved by nothing. The abdominal pain is exacerbated by movement and vomiting.  Nausea began 6 to 7 days ago. The nausea is associated with eating. The nausea is exacerbated by food.  The diarrhea began 6 to 7 days ago. The diarrhea is semi-solid. The diarrhea occurs 2 to 4 times per day. Risk factors for illness producing diarrhea include recent antibiotic use.  The illness is associated with recent antibiotic use (only a few doses). Additional symptoms associated with the illness include anorexia. Symptoms associated with the illness do not include chills, diaphoresis, constipation, urgency, hematuria, frequency or back pain. Associated medical issues comments: hx ischemic colitis in past x2.    Past Medical History  Diagnosis Date  . Colitis   . Ischemic colitis, enteritis,  or enterocolitis   . Hypertension   . GERD (gastroesophageal reflux disease)     Past Surgical History  Procedure Date  . Abdominal hysterectomy     History reviewed. No pertinent family history.  History  Substance Use Topics  . Smoking status: Not on file  . Smokeless tobacco: Not on file  . Alcohol Use: No    OB History    Grav Para Term Preterm Abortions TAB SAB Ect Mult Living                  Review of Systems  Constitutional: Positive for appetite change. Negative for fever, chills, diaphoresis, activity change and fatigue.  HENT: Negative for congestion, sore throat, rhinorrhea, neck pain and neck stiffness.   Respiratory: Negative for cough, chest tightness and shortness of breath.   Cardiovascular: Negative for chest pain and palpitations.  Gastrointestinal: Positive for nausea, abdominal pain, diarrhea and anorexia. Negative for vomiting, constipation, hematochezia, anal bleeding and hematemesis.  Genitourinary: Negative for dysuria, urgency, frequency, hematuria, flank pain, vaginal bleeding and vaginal discharge.  Musculoskeletal: Negative for myalgias and back pain.  Neurological: Negative for dizziness, weakness, light-headedness, numbness and headaches.  All other systems reviewed and are negative.    Allergies  Review of patient's allergies indicates no known allergies.  Home Medications  No current outpatient prescriptions on file.  BP 129/88  Pulse 104  Temp(Src) 97 F (36.1 C) (Oral)  Resp 20  Ht 5\' 3"  (1.6 m)  Wt 160 lb (72.576 kg)  BMI 28.34 kg/m2  SpO2 99%  Physical Exam  Nursing note and vitals reviewed. Constitutional: She is oriented to person, place, and time. She appears well-developed and well-nourished. No  distress.  HENT:  Head: Normocephalic and atraumatic.  Mouth/Throat: Oropharynx is clear and moist. No oropharyngeal exudate.  Eyes: Conjunctivae and EOM are normal. Pupils are equal, round, and reactive to light.  Neck:  Normal range of motion. Neck supple.  Cardiovascular: Regular rhythm, normal heart sounds and intact distal pulses.  Tachycardia present.  Exam reveals no gallop and no friction rub.   No murmur heard. Pulmonary/Chest: Effort normal and breath sounds normal. No respiratory distress.  Abdominal: Soft. Bowel sounds are normal. There is tenderness (diffuse pain both entire lower abdomen and epigastrium). There is no rebound and no guarding.  Musculoskeletal: Normal range of motion. She exhibits no tenderness.  Neurological: She is alert and oriented to person, place, and time.  Skin: Skin is warm and dry. No rash noted.    ED Course  Procedures (including critical care time)  Labs Reviewed  COMPREHENSIVE METABOLIC PANEL - Abnormal; Notable for the following:    AST 132 (*)    ALT 230 (*)    All other components within normal limits  URINALYSIS, ROUTINE W REFLEX MICROSCOPIC - Abnormal; Notable for the following:    Appearance HAZY (*)    Ketones, ur TRACE (*)    All other components within normal limits  CBC  DIFFERENTIAL  LIPASE, BLOOD  LACTIC ACID, PLASMA   Ct Abdomen Pelvis W Contrast  08/29/2011  *RADIOLOGY REPORT*  Clinical Data: Abdominal pain.  History of colitis  CT ABDOMEN AND PELVIS WITH CONTRAST  Technique:  Multidetector CT imaging of the abdomen and pelvis was performed following the standard protocol during bolus administration of intravenous contrast.  Contrast: OMNIPAQUE IOHEXOL 300 MG/ML IV SOLN  Comparison: 02/03/2011  Findings: Lung bases are clear.  No pericardial or pleural effusion.  There is mild diffuse fatty infiltration of the liver.  No focal liver abnormality.  The gallbladder appears normal.  The pancreas is unremarkable.  Normal appearance of the spleen.  The adrenal glands are both normal.  Normal appearance of both kidneys.  No hydronephrosis.  No upper abdominal adenopathy.  There is no pelvic or inguinal adenopathy.  Prior hysterectomy. Left ovarian  cyst measures 2.7 cm, image 69.  Previously 2.6 cm.  Right ovary appears normal.  The stomach is normal.  The small bowel loops are unremarkable.  There is abnormal wall thickening from the hepatic flexure to the rectum.  There is a mild pericolonic hyperemia.  No evidence for perforation, or abscess.  No abscess formation.  IMPRESSION:  1.  Mild colitis involving the transverse colon and left colon. 2.  No complicating features identified.  Specifically, there is no evidence for perforation, abscess or bowel obstruction.  Original Report Authenticated By: Rosealee Albee, M.D.     1. Colitis   2. Hepatitis       MDM  Remained stable one emergency department. She arrived mildly tachycardic. Administered IV fluids, Pepcid, Zofran, Dilaudid for symptom control. On reassessment she appears more comfortable. She tolerated all of the oral contrast without vomiting. CT scan showed colitis. Laboratory studies were unremarkable except for a mild elevation of her AST and ALT. This is a nonspecific elevation however could indicate early hepatitis. Given the constellation of her findings I feel the patient warrants admission to the hospital for further evaluation and treatment. I started her on Cipro and Flagyl. She is a patient of Dr. Megan Mans. I talked with Dr. Sudie Bailey who is on-call for the group who accepted the patient for admission.  Dayton Bailiff, MD 08/29/11 [phone]

## 2011-08-29 NOTE — ED Notes (Signed)
Drank contrast for CT and has tolerated well----Taken to CT--Stable

## 2011-08-29 NOTE — ED Notes (Signed)
Pt c/o abd pain x one week. Pt has been on antibiotics for a finger infection.

## 2011-08-29 NOTE — ED Notes (Signed)
Report called to [facility] and pt. Transported to Room 322 via stretcher.  Stable

## 2011-08-29 NOTE — ED Notes (Signed)
B/P  122/76  HR  65  P.O. 97%  Oral temp 98.4

## 2011-08-29 NOTE — ED Notes (Signed)
C/o lower mid abdominal pain as well as Right upper quadrant pain radiating into back---Treated recently with Clindamycin for a nail infection and shortly after beginning antibiotic she began having the abdominal pain--History for numerous intestinal problems---She took one 10 mg Dicyclomine tablet this a.m. Which helped decrease the pain.

## 2011-08-29 NOTE — H&P (Signed)
NAME:  Angela Boyer, WINEBARGER NO.:  [id number]  MEDICAL RECORD NO.:  [id number]  LOCATION:  A322                          FACILITY:  [facility]  PHYSICIAN:  Mila Homer. Sudie Bailey, M.D.DATE OF BIRTH:  25-Sep-1961  DATE OF ADMISSION:  08/29/2011 DATE OF DISCHARGE:  LH                             HISTORY & PHYSICAL   This 50 year old presented to the emergency room with severe abdominal pain for about a week.  She has had stool that has been regular, but with diarrhea at the end of defecation.  Today, she had 4 formed bowel movements, but then fairly severe abdominal pain.  She has been hospitalized twice with ischemic colitis, the first time in [place] where she was visiting her grandchildren, and the second time at [facility].  Her gastroenterologist is Dr. Karilyn Cota.  Other history shows that she smoked cigarettes from age 29 to 18, and also drank alcohol during that time.  She never smoked more than a quarter pack a day, however.  She has not smoked in 16 years.  She is currently married.  She had a brain aneurysm within the last 10 years and was operated on at [facility] in [place].  It turned out she had 2 aneurysms and both were clipped at that time by her neurosurgeon.  She also had a hysterectomy and apparently one ovary removed.  She has burning tongue syndrome.  CURRENT MEDICATIONS: 1. Amlodipine 10 mg daily. 2. Benazepril 20 mg daily. 3. Clonazepam 0.5 mg b.i.d. 4. Gabapentin 600 mg b.i.d. 5. Omeprazole 40 mg daily. 6. Oxycodone/APAP 5/325 t.i.d. p.r.n. pain. 7. Zolpidem 10 mg q.h.s. for sleep. 8. " __________ " 0.05% gel to be used 3 times a month on her tongue     for the burning tongue syndrome.  She tells that she actually uses     the Klonopin actually once a day, usually round 5:00 p.m. and takes     the gabapentin at the same time.  She has had no cardiac, pulmonary, or gastric or GU symptomatology.  Her  temperature is 98 degrees, pulse 61, respiratory rate 18, blood pressure 123/78, O2 sats 98%.  She is well-developed, well-nourished, oriented and alert and supine in bed at the time I talked to her.  Her husband was with her at this time.  Her speech was normal.  Sentence structure intact.  Affect good.  Her heart had a regular rhythm and rate of about 80.  Her lungs were clear throughout.  She is moving air well. She had no axillary, supraclavicular, or anterior cervical adenopathy. The abdomen was soft without organomegaly or mass, but she did have tenderness in the epigastrium and some of the right upper quadrant as well.  She had trace edema of the ankles, but the feet were warm.  Her admission white cell count was 8100, of which 58% were neutrophils, 30 lymphs.  Her hemoglobin was 14.8.  Lactic acid 0.9.  Her CMP was normal except for an AST of 132, an ALT of 230.  UA was essentially negative.  The CT scan of the abdomen and pelvis showed a mild colitis involving the transverse colon and left colon.  ADMISSION DIAGNOSES: 1. Colitis, questionable etiology. 2. Benign essential hypertension. 3. Burning tongue syndrome. 4. Reflux esophagitis. 5. Insomnia. 6. Status post clipping of 2 cerebral aneurysms. 7. Status post hysterectomy. 8. Ovarian cyst on CT scan, unchanged from the last CT.  I discussed her case with Dr. Raj Janus, gastroenterology.  I am continuing home medication and putting her on full liquids and also putting her on Cipro 500 mg b.i.d. and metronidazole 500 mg t.i.d. Routine stool cultures are pending as is a C difficile antigen.  Follow- up with Dr. Darrick Penna tomorrow and by Dr. Renard Matter, her primary care doctor, in 2 days on his return from the weekend.     Mila Homer. Sudie Bailey, M.D.     SDK/MEDQ  D:  08/29/2011  T:  08/29/2011  Job:  [id number]

## 2011-08-30 DIAGNOSIS — K5289 Other specified noninfective gastroenteritis and colitis: Secondary | ICD-10-CM

## 2011-08-30 LAB — HEPATIC FUNCTION PANEL
ALT: 177 U/L — ABNORMAL HIGH (ref 0–35)
AST: 81 U/L — ABNORMAL HIGH (ref 0–37)
Alkaline Phosphatase: 82 U/L (ref 39–117)
Bilirubin, Direct: 0.1 mg/dL (ref 0.0–0.3)
Total Bilirubin: 0.4 mg/dL (ref 0.3–1.2)

## 2011-08-30 MED ORDER — METRONIDAZOLE IN NACL 5-0.79 MG/ML-% IV SOLN
500.0000 mg | Freq: Three times a day (TID) | INTRAVENOUS | Status: DC
  Administered 2011-08-30 – 2011-09-01 (×6): 500 mg via INTRAVENOUS
  Filled 2011-08-30 (×7): qty 100

## 2011-08-30 MED ORDER — ALUM & MAG HYDROXIDE-SIMETH 200-200-20 MG/5ML PO SUSP
30.0000 mL | ORAL | Status: DC | PRN
  Administered 2011-08-30 (×2): 30 mL via ORAL
  Filled 2011-08-30 (×2): qty 30

## 2011-08-30 MED ORDER — PANTOPRAZOLE SODIUM 40 MG PO TBEC
40.0000 mg | DELAYED_RELEASE_TABLET | Freq: Once | ORAL | Status: AC
  Administered 2011-08-30: 40 mg via ORAL
  Filled 2011-08-30: qty 1

## 2011-08-30 MED ORDER — PANTOPRAZOLE SODIUM 40 MG PO TBEC
40.0000 mg | DELAYED_RELEASE_TABLET | Freq: Every day | ORAL | Status: DC
  Administered 2011-08-30: 40 mg via ORAL
  Filled 2011-08-30: qty 1

## 2011-08-30 MED ORDER — PANTOPRAZOLE SODIUM 40 MG PO TBEC
80.0000 mg | DELAYED_RELEASE_TABLET | Freq: Every day | ORAL | Status: DC
  Administered 2011-08-31 – 2011-09-03 (×4): 80 mg via ORAL
  Filled 2011-08-30 (×4): qty 2

## 2011-08-30 MED ORDER — CIPROFLOXACIN IN D5W 400 MG/200ML IV SOLN
400.0000 mg | Freq: Two times a day (BID) | INTRAVENOUS | Status: DC
  Administered 2011-08-30 – 2011-09-01 (×4): 400 mg via INTRAVENOUS
  Filled 2011-08-30 (×5): qty 200

## 2011-08-30 MED ORDER — ONDANSETRON HCL 4 MG/2ML IJ SOLN
4.0000 mg | Freq: Three times a day (TID) | INTRAMUSCULAR | Status: DC
  Administered 2011-08-30 – 2011-09-03 (×17): 4 mg via INTRAVENOUS
  Filled 2011-08-30 (×14): qty 2

## 2011-08-30 NOTE — Progress Notes (Signed)
NAME:  Angela Boyer, Angela Boyer NO.:  [id number]  MEDICAL RECORD NO.:  [id number]  LOCATION:  A322                          FACILITY:  [facility]  PHYSICIAN:  Mila Homer. Sudie Bailey, M.D.DATE OF BIRTH:  December 16, 1960  DATE OF PROCEDURE: DATE OF DISCHARGE:                                PROGRESS NOTE   SUBJECTIVE:  The patient said she had a bad night but also noted that really her pain was not bad, she only needed one more injection of hydromorphone overnight.  She did pretty well until this morning when she had breakfast which turned out to be soft solids.  Shortly after having breakfast,  she had some lower abdominal cramping and  six to eight bouts of diarrhea.  OBJECTIVE:  Temperature is 98.1, pulse 76, respiratory rate 17, blood pressure 127/78.  She is sitting up in bed.  She looks somewhat depressed and worried.  Her heart has an absolutely regular rhythm without murmur,  rate of about 80 and her lungs are clear throughout. She is moving air well.  Her color is good.  Her abdomen really is soft without organomegaly or mass but she does have very minimal tenderness in the epigastrium and slightly more tenderness in the lower abdomen.  Today her AST is 81, down from 132 and her ALT is 177, down from 230.  ASSESSMENT: 1. Colitis, questionable etiology. 2. Benign essential hypertension. 3. Probably antibiotic-associated diarrhea. 4. Insomnia.  PLAN:  Talked about all this at length.  I think a lot of her insomnia is secondary to worry.  We will  continue with hydromorphone.  We discussed the impact of Cipro and metronidazole on her digestive tract. She may well have an antibiotic-associated diarrhea at this point. Culture was done today but she has already been on antibiotics and C. Difficile antigen is pending.  She is to be seen by her LMD, Dr. Butch Penny , by her gastroenterologist, Dr. Karilyn Cota tomorrow.     Mila Homer. Sudie Bailey, M.D.     SDK/MEDQ  D:   08/30/2011  T:  08/30/2011  Job:  [id number]

## 2011-08-30 NOTE — Consult Note (Addendum)
Referring Provider: No ref. provider found Primary Care Physician:  Renard Matter, MD PRIMARY GASTROENTEROLOGIST:  DR. Karilyn Cota  Reason for Consultation:  1 week ago diarrhea and pain  HPI:   HAD FINGER INFECTION AND PLACED ON AbX-STARTED IT WITH A "C". Lower abd pain: SHARP. CRAMPY and in the top 1 week ago. NAUSEA: better with Zofran. No vomiting. TERRIBLE HEARTBURN. In BM every AM from 4 am to 11 am and sometimes until 1 pm. Took dicyclomine and diarrhea eased up & so not better and drove to hospital. NO BLOOD EVER IN STOOLS.  NO WEIGHT LOSS. YESTERDAY: BMs: 6-7, NO BLOOD.TODAY: 4 BMS-SML QUANTITY, NO BLOOD.  Past Medical History  Diagnosis Date  . Colitis   . Ischemic colitis, enteritis, or enterocolitis   . Hypertension   . GERD (gastroesophageal reflux disease)   . Anxiety     Past Surgical History  Procedure Date  . Abdominal hysterectomy   . Brain surgery   . Appendectomy     Prior to Admission medications   Medication Sig Start Date End Date Taking? Authorizing Provider  amLODipine (NORVASC) 10 MG tablet Take 10 mg by mouth daily.     Yes Historical Provider, MD  benazepril (LOTENSIN) 20 MG tablet Take 20 mg by mouth daily.     Yes Historical Provider, MD  clonazePAM (KLONOPIN) 0.5 MG tablet Take 0.5 mg by mouth 2 (two) times daily.     Yes Historical Provider, MD  gabapentin (NEURONTIN) 600 MG tablet Take 600 mg by mouth 2 (two) times daily.     Yes Historical Provider, MD  omeprazole (PRILOSEC) 40 MG capsule Take 40 mg by mouth daily.     Yes Historical Provider, MD  oxyCODONE-acetaminophen (PERCOCET) 5-325 MG per tablet Take 1 tablet by mouth 3 (three) times daily as needed. pain    Yes Historical Provider, MD  zolpidem (AMBIEN) 10 MG tablet Take 10 mg by mouth at bedtime.     Yes Historical Provider, MD    Current Facility-Administered Medications  Medication Dose Route Frequency Provider Last Rate Last Dose  . 0.9 %  sodium chloride infusion   Intravenous Continuous  Milana Obey 75 mL/hr at 08/30/11 1021    . acetaminophen (TYLENOL) tablet 500 mg  500 mg Oral Q4H PRN Milana Obey      . amLODipine (NORVASC) tablet 10 mg  10 mg Oral Daily Mila Homer Knowlton   10 mg at 08/30/11 0912  . benazepril (LOTENSIN) tablet 20 mg  20 mg Oral Daily Mila Homer Knowlton   20 mg at 08/30/11 0911  . ciprofloxacin (CIPRO) tablet 500 mg  500 mg Oral BID Mila Homer Knowlton   500 mg at 08/30/11 4098  . clonazePAM (KLONOPIN) tablet 0.5 mg  0.5 mg Oral Daily Mila Homer Knowlton   0.5 mg at 08/29/11 2019  . famotidine (PEPCID) IVPB 20 mg  20 mg Intravenous Once Dayton Bailiff, MD   20 mg at 08/29/11 1343  . gabapentin (NEURONTIN) capsule 600 mg  600 mg Oral BID Mindy Swaziland Holcombe, PHARMD   600 mg at 08/30/11 0911  . HYDROmorphone (DILAUDID) injection 1 mg  1 mg Intravenous Once Dayton Bailiff, MD   1 mg at 08/29/11 1342  . HYDROmorphone (DILAUDID) injection 2 mg  2 mg Intravenous Q3H PRN Milana Obey   2 mg at 08/30/11 1191  . iohexol (OMNIPAQUE) 300 MG/ML injection 100 mL  100 mL Intravenous Once PRN Medication Radiologist   100 mL at 08/29/11 1511  .  metroNIDAZOLE (FLAGYL) IVPB 500 mg  500 mg Intravenous Once Dayton Bailiff, MD   500 mg at 08/29/11 1636  . metroNIDAZOLE (FLAGYL) tablet 500 mg  500 mg Oral Q8H Mila Homer Knowlton   500 mg at 08/30/11 0506  . ondansetron (ZOFRAN) injection 4 mg  4 mg Intravenous Once Dayton Bailiff, MD   4 mg at 08/29/11 1341  . ondansetron (ZOFRAN) injection 4 mg  4 mg Intravenous Q6H PRN Mila Homer Knowlton   4 mg at 08/30/11 0911  . oxyCODONE-acetaminophen (PERCOCET) 5-325 MG per tablet 1 tablet  1 tablet Oral TID PRN Milana Obey   1 tablet at 08/29/11 2019  . pantoprazole (PROTONIX) EC tablet 40 mg  40 mg Oral Daily Angus G McInnis   40 mg at 08/30/11 1019  . sodium chloride 0.9 % bolus 1,000 mL  1,000 mL Intravenous Once Dayton Bailiff, MD   1,000 mL at 08/29/11 1342  . zolpidem (AMBIEN) tablet 10 mg  10 mg Oral QHS Mila Homer Knowlton    10 mg at 08/29/11 2055  . DISCONTD: ciprofloxacin (CIPRO) IVPB 400 mg  400 mg Intravenous Q12H Dayton Bailiff, MD   400 mg at 08/29/11 1724  . DISCONTD: clonazePAM (KLONOPIN) tablet 0.5 mg  0.5 mg Oral BID Milana Obey      . DISCONTD: gabapentin (NEURONTIN) tablet 600 mg  600 mg Oral BID Milana Obey      . DISCONTD: gabapentin (NEURONTIN) tablet 600 mg  600 mg Oral BID Milana Obey      . DISCONTD: metroNIDAZOLE (FLAGYL) tablet 500 mg  500 mg Oral Q8H Milana Obey      . DISCONTD: pantoprazole (PROTONIX) EC tablet 40 mg  40 mg Oral Q1200 Milana Obey      . DISCONTD: zolpidem (AMBIEN) tablet 10 mg  10 mg Oral QHS Mila Homer Knowlton        Allergies as of 08/29/2011  . (No Known Allergies)    Family History:  Colon Cancer  pos                           Polyps  pos   History   Social History  . Marital Status: Married    Spouse Name: N/A    Number of Children: N/A  . Years of Education: N/A   Occupational History  . Not on file.   Social History Main Topics  . Smoking status: Never Smoker   . Smokeless tobacco: Never Used  . Alcohol Use: No  . Drug Use: No  . Sexually Active: Yes    Birth Control/ Protection: None   Other Topics Concern  . Not on file   Social History Narrative  . No narrative on file    Review of Systems: PER HPI OTHERWISE ALL SYSTEMS NEGATIVE No blood products. No IV drugs or recreational drug use. NO ETOH.   Vitals: Blood pressure 127/78, pulse 76, temperature 98.1 F (36.7 C), temperature source Oral, resp. rate 17, height 5\' 3"  (1.6 m), weight 162 lb 4.1 oz (73.6 kg), SpO2 98.00%.  Physical Exam: General:   Alert, pleasant and cooperative in NAD Head:  Normocephalic and atraumatic. Eyes:  Sclera clear, no icterus.   Conjunctiva pink. Mouth:  No deformity or lesions, dentition normal. Neck:  Supple; no masses  Lungs:  Clear throughout to auscultation.   No wheezes. No acute distress. Heart:  Regular rate and  rhythm; no murmurs  Abdomen:  Soft, nontender and nondistended. No masses, hepatosplenomegaly or hernias noted. Normal bowel sounds, without guarding, and without rebound.   Msk:  Symmetrical without gross deformities. Normal posture. Extremities:  Without edema. Neurologic:  Alert and  oriented x4;  grossly normal neurologically. Skin:  Intact without significant lesions or rashes. Cervical Nodes:  No significant cervical adenopathy. Psych:  Alert and cooperative. FLAT affect.   Lab Results:  [facility] 08/29/11 1336  WBC 8.1  HGB 14.8  HCT 43.7  PLT 227   BMET  Basename 08/29/11 1336  NA 141  K 4.1  CL 102  CO2 31  GLUCOSE 89  BUN 9  CREATININE 0.74  CALCIUM 10.1   LFT  Basename 08/30/11 0535  PROT 6.4  ALBUMIN 3.3*  AST 81*  ALT 177*  ALKPHOS 82  BILITOT 0.4  BILIDIR 0.1  IBILI 0.3     Studies/Results:  Impression: GERD, NOT CONTROLLED. COLITIS-continues with abd pain and diarrhea Differential diagnosis includes infectious colitis(?CDIFF) and less likely ischemic or IBD. Stool studies pending. Elevated liver enzymes-etiology unclear-NASH?  Plan: Full liquid diet D/C po Abx and change to IV. ZOFRAN ATC & PRN. Continue Protonix. AWAIT STOOL STUDIES AND ACUTE HEPATITIS PANEL. May need serologies for AUTOIMMUNE HEPATITIS. DR. Karilyn Cota TO RESUME CARE 10/8. CONSIDER FLEIX SIG IF PT DOES NOT IMPROVE. Abd u/s 10/8   LOS: 1 day   Kail Fraley  08/30/2011, 12:27 PM

## 2011-08-31 ENCOUNTER — Inpatient Hospital Stay (HOSPITAL_COMMUNITY): Payer: Medicare (Managed Care)

## 2011-08-31 DIAGNOSIS — K5289 Other specified noninfective gastroenteritis and colitis: Secondary | ICD-10-CM

## 2011-08-31 LAB — HEPATITIS PANEL, ACUTE: Hep A IgM: NEGATIVE

## 2011-08-31 MED ORDER — LOPERAMIDE HCL 2 MG PO CAPS
2.0000 mg | ORAL_CAPSULE | Freq: Three times a day (TID) | ORAL | Status: AC
  Administered 2011-08-31 – 2011-09-02 (×6): 2 mg via ORAL
  Filled 2011-08-31 (×5): qty 1

## 2011-08-31 MED ORDER — LIDOCAINE HCL 2 % EX GEL
CUTANEOUS | Status: DC | PRN
  Filled 2011-08-31: qty 5

## 2011-08-31 MED ORDER — SODIUM CHLORIDE 0.9 % IJ SOLN
INTRAMUSCULAR | Status: AC
  Filled 2011-08-31: qty 10

## 2011-08-31 MED ORDER — HYDROCORTISONE 2.5 % RE CREA
TOPICAL_CREAM | Freq: Every day | RECTAL | Status: DC | PRN
  Filled 2011-08-31: qty 28.35

## 2011-08-31 MED ORDER — NYSTATIN-TRIAMCINOLONE 100000-0.1 UNIT/GM-% EX CREA
1.0000 "application " | TOPICAL_CREAM | Freq: Two times a day (BID) | CUTANEOUS | Status: DC
  Administered 2011-08-31 – 2011-09-03 (×6): 1 via TOPICAL
  Filled 2011-08-31: qty 15

## 2011-08-31 MED ORDER — LIDOCAINE-PRILOCAINE 2.5-2.5 % EX CREA: TOPICAL_CREAM | CUTANEOUS | Status: DC | PRN

## 2011-08-31 MED ORDER — HYDROCORTISONE ACETATE 25 MG RE SUPP
25.0000 mg | Freq: Two times a day (BID) | RECTAL | Status: DC | PRN
  Filled 2011-08-31: qty 1

## 2011-08-31 NOTE — Progress Notes (Signed)
NAME:  HLEE, FRINGER NO.:  [id number]  MEDICAL RECORD NO.:  [id number]  LOCATION:  A322                          FACILITY:  [facility]  PHYSICIAN:  Lannis Lichtenwalner G. Renard Matter, MD   DATE OF BIRTH:  06/04/1961  DATE OF PROCEDURE: DATE OF DISCHARGE:                                PROGRESS NOTE   SUBJECTIVE:  This patient had fairly good night.  Has history of occasional diarrheal stool.  She was admitted with colitis of questionable etiology.  She does have a history of hypertension, possible antibiotic-associated diarrhea, and insomnia.  She was seen by Gastroenterology Service, Dr. Darrick Penna who felt that the differential diagnosis included infectious colitis, questionable Clostridium difficile less likely ischemic.  Stool culture is still pending.  OBJECTIVE:  VITAL SIGNS:  Blood pressure 126/76, respirations 16, pulse 69, temp 98.4. LUNGS:  Clear and P and A. HEART:  Regular rhythm. ABDOMEN:  Generalized tenderness to palpation.  ASSESSMENT:  The patient was admitted with colitis of questionable etiology, benign essential hypertension, insomnia.  PLAN:  To continue Cipro and metronidazole.  Cultures pending.  The patient will be seen by Gastroenterology Service today as well.     Khamauri Bauernfeind G. Renard Matter, MD     AGM/MEDQ  D:  08/31/2011  T:  08/31/2011  Job:  [id number]

## 2011-08-31 NOTE — Progress Notes (Signed)
Subjective; patient continues to complain of diarrhea. She said close to 8 stools today; she also complains of rawness in the perianal region. He complains of pain across her lower abdomen as well as in epigastric region radiating to the rib cage and posteriorly. Her appetite is poor. She is tolerating full liquids. He denies nausea or vomiting. Objective; BP 100/65  Pulse 65  Temp(Src) 97.4 F (36.3 C) (Oral)  Resp 20  Ht 5\' 3"  (1.6 m)  Wt 162 lb 4.1 oz (73.6 kg)  BMI 28.74 kg/m2  SpO2 95% Abdomen is full. Bowel sounds are hyperactive. On palpation soft abdomen with mild tenderness in epigastric region and across lower abdomen. Percussion note is tympanitic. No organomegaly or masses noted No peripheral edema  Lab data Stool C. difficile by PCR is negative Stool culture and O&P are also negative AST is down to 81 from 130 to 2 days ago and ALT is down to 177 to 230 two days ago. Hepatitis B surface antigen is negative. Hepatitis C. antibodies negative. Hepatitis A IgM antibodies negative and hepatitis B core IgM antibody is negative Ultrasound of upper abdomen; shows cholelithiasis without thickening of gallbladder wall bile duct is 3 mm in diameter; mild fatty liver and left ovarian cyst. Assessment #1. Acute colitis possibly an infection or secondary to recent use of clindamycin will studies are negative and patient is on IV Cipro and metronidazole. Still with significant diarrhea but does not appear to be toxic. Antibiotics could be switched to oral route in a.m. #2. Elevated transaminases. Suspect she may have passed a stone. Some of her pain appears to be biliary type of pain. She will need to have cholecystectomy yesterday she is feeling better in reference to acute colitis. Recommendations Repeat LFTs in a.m. Mycolog-II cream to be applied to perianal area twice a day Imodium OTC 2 mg 3 times a day x6 doses Surgical consultation for symptomatic cholelithiasis.

## 2011-09-01 LAB — BASIC METABOLIC PANEL
BUN: 5 mg/dL — ABNORMAL LOW (ref 6–23)
CO2: 33 mEq/L — ABNORMAL HIGH (ref 19–32)
Calcium: 9.2 mg/dL (ref 8.4–10.5)
Creatinine, Ser: 0.71 mg/dL (ref 0.50–1.10)
Glucose, Bld: 92 mg/dL (ref 70–99)

## 2011-09-01 LAB — HEPATIC FUNCTION PANEL
ALT: 134 U/L — ABNORMAL HIGH (ref 0–35)
AST: 69 U/L — ABNORMAL HIGH (ref 0–37)
Alkaline Phosphatase: 81 U/L (ref 39–117)
Indirect Bilirubin: 0.3 mg/dL (ref 0.3–0.9)
Total Protein: 6.3 g/dL (ref 6.0–8.3)

## 2011-09-01 MED ORDER — CIPROFLOXACIN HCL 250 MG PO TABS
500.0000 mg | ORAL_TABLET | Freq: Two times a day (BID) | ORAL | Status: DC
  Administered 2011-09-01 – 2011-09-04 (×7): 500 mg via ORAL
  Filled 2011-09-01 (×7): qty 2

## 2011-09-01 MED ORDER — METRONIDAZOLE 500 MG PO TABS
500.0000 mg | ORAL_TABLET | Freq: Two times a day (BID) | ORAL | Status: DC
  Administered 2011-09-01 – 2011-09-03 (×6): 500 mg via ORAL
  Filled 2011-09-01 (×6): qty 1

## 2011-09-01 NOTE — Progress Notes (Signed)
NAME:  Angela Boyer, Angela Boyer NO.:  [id number]  MEDICAL RECORD NO.:  [id number]  LOCATION:  A322                          FACILITY:  [facility]  PHYSICIAN:  Laporsha Grealish G. Renard Matter, MD   DATE OF BIRTH:  February 16, 1961  DATE OF PROCEDURE: DATE OF DISCHARGE:                                PROGRESS NOTE   This patient had a fairly good night with occasional diarrheal stool. She was admitted with colitis of questionable etiology.  She does have a history of hypertension, antibiotic-associated diarrhea, and insomnia. She was seen by Gastroenterology Service yesterday.  They noted elevated transaminases and suspects she may have passed a stone, some of her pain seem to be biliary-type pain.  It was felt that by Dr. Karilyn Cota she would need a cholecystectomy.  OBJECTIVE:  VITAL SIGNS:  Blood pressure 102/65, respirations 20, pulse 56, temp 97.5. LUNGS:  Clear to P and A. HEART:  Regular rhythm. ABDOMEN:  The patient has mild tenderness in the epigastric region across lower abdomen.  CBC, WBC 8100 with hemoglobin 14.8, hematocrit 43.7.  Chemistries essentially within normal range.  ASSESSMENT:  The patient did have acute episode of colitis possibly secondary to clindamycin.  She does have elevated transaminase, suspect she may have passed a stone and some of her pain, biliary-type pain.  PLAN:  To continue current regimen.  We will obtain Surgical consult with Dr. Lovell Sheehan and Leticia Penna.  Continue current regimen.     Shantrice Rodenberg G. Renard Matter, MD     AGM/MEDQ  D:  09/01/2011  T:  09/01/2011  Job:  [id number]

## 2011-09-01 NOTE — Consult Note (Signed)
Reason for Consult: Cholelithiasis, elevated transaminases Referring Physician: Dr. Renard Matter, Angela Boyer is an 50 y.o. female.  HPI: Patient is a 50 year old white female who was admitted with the diagnosis of colitis. It is felt that this is secondary to recent clindamycin usage. Colitis was noted in the distal transverse colon as well as the proximal descending colon. Her transaminases were noted to be elevated and an ultrasound of the gallbladder revealed cholelithiasis without a thickened gallbladder wall. The common bile duct was within normal limits. She states she primarily has left sided abdominal pain and diarrhea, though she has had epigastric right upper cord and pain during this admission. She denies any fever, chills, or jaundice. She denies any history of fatty food intolerance.  Past Medical History  Diagnosis Date  . Colitis   . Ischemic colitis, enteritis, or enterocolitis   . Hypertension   . GERD (gastroesophageal reflux disease)   . Anxiety     Past Surgical History  Procedure Date  . Abdominal hysterectomy   . Brain surgery   . Appendectomy     History reviewed. No pertinent family history.  Social History:  reports that she has never smoked. She has never used smokeless tobacco. She reports that she does not drink alcohol or use illicit drugs.  Allergies: No Known Allergies  Medications: I have reviewed the patient's current medications.  Results for orders placed during the hospital encounter of 08/29/11 (from the past 48 hour(s))  HEPATIC FUNCTION PANEL     Status: Abnormal   Collection Time   09/01/11  4:56 AM      Component Value Range Comment   Total Protein 6.3  6.0 - 8.3 (g/dL)    Albumin 3.4 (*) 3.5 - 5.2 (g/dL)    AST 69 (*) 0 - 37 (U/L)    ALT 134 (*) 0 - 35 (U/L)    Alkaline Phosphatase 81  39 - 117 (U/L)    Total Bilirubin 0.4  0.3 - 1.2 (mg/dL)    Bilirubin, Direct 0.1  0.0 - 0.3 (mg/dL)    Indirect Bilirubin 0.3  0.3 - 0.9  (mg/dL)   BASIC METABOLIC PANEL     Status: Abnormal   Collection Time   09/01/11  4:56 AM      Component Value Range Comment   Sodium 142  135 - 145 (mEq/L)    Potassium 4.1  3.5 - 5.1 (mEq/L)    Chloride 103  96 - 112 (mEq/L)    CO2 33 (*) 19 - 32 (mEq/L)    Glucose, Bld 92  70 - 99 (mg/dL)    BUN 5 (*) 6 - 23 (mg/dL)    Creatinine, Ser 1.61  0.50 - 1.10 (mg/dL)    Calcium 9.2  8.4 - 10.5 (mg/dL)    GFR calc non Af Amer >90  >90 (mL/min)    GFR calc Af Amer >90  >90 (mL/min)     US Abdomen Limited  08/31/2011  *RADIOLOGY REPORT*  Clinical Data:  Elevated liver enzymes.  LIMITED ABDOMINAL ULTRASOUND - RIGHT UPPER QUADRANT  Comparison:  CT 08/29/2011  Findings:  Gallbladder:  Several shadowing foci within the gallbladder compatible with gallstones, the largest measuring 14 mm.  No wall thickening.  Negative sonographic Murphy's.  Common bile duct:  Normal caliber, 3 mm.  Liver:  Mild increased echotexture suggesting fatty infiltration. No biliary ductal dilatation.  IMPRESSION: Cholelithiasis.  Fatty liver.  Original Report Authenticated By: Cyndie Chime, M.D.    ROS: See chart Blood pressure 111/70, pulse 70, temperature 97.7 F (36.5 C), temperature source Oral, resp. rate 20, height 5\' 3"  (1.6 m), weight 73 kg (160 lb 15 oz), SpO2 100.00%. Physical Exam: Well-developed, well-nourished white female in no acute distress. Abdomen: Soft, flat. Nonspecific tenderness noted in the upper abdomen as well as along the left side. No specific McBurney's point tenderness is noted. No rigidity is noted. Active bowel sounds heard.  Assessment/Plan: Biliary colic, cholelithiasis: Agree with need for laparoscopic cholecystectomy, though I would not do it this admission. I would like the colitis to resolve prior to any further surgical intervention. Dr. Karilyn Cota agrees. Will set up patient to see me as an outpatient.  Nickolis Diel A 09/01/2011, 11:18 AM

## 2011-09-01 NOTE — Progress Notes (Signed)
Subjective: Since I last evaluated the patient, she is still having diarrhea. She had 3 loose, watery stools after midnight.  She also c/o pain across her epigastric region and rt upper quadrant.  No nausea or vomiting.  Hepatitis Markers are all negative. C-diff negative.  Objective: Vital signs in last 24 hours: Temp:  [97.1 F (36.2 C)-97.7 F (36.5 C)] 97.7 F (36.5 C) (10/09 0620) Pulse Rate:  [56-71] 70  (10/09 0620) Resp:  [18-20] 20  (10/09 0620) BP: (100-113)/(62-71) 111/70 mmHg (10/09 0620) SpO2:  [95 %-100 %] 100 % (10/09 0620) Weight:  [160 lb 15 oz (73 kg)] 160 lb 15 oz (73 kg) (10/09 0620) Last BM Date: 08/31/11  Intake/Output from previous day: 10/08 0701 - 10/09 0700 In: 360 [P.O.:360] Out: -  Intake/Output this shift:  General appearance: alert, cooperative and appears stated age   Skin warm and dry. Oral mucosa is moist. Natural teeth in good condition. Sclera anicteric, conjunctivae is pink. No cervical lymphadenopathy. Lungs clear. Heart regular rate and rhythm.  Abdomen is soft. Bowel sounds are positive.  Tenderness rt upper quadrant. No abdominal masses felt. No tenderness.  No edema to lower extremities. Patient is alert and oriented.    Lab Results:  [facility] 08/29/11 1336  WBC 8.1  HGB 14.8  HCT 43.7  PLT 227   BMET  Basename 09/01/11 0456 08/29/11 1336  NA 142 141  K 4.1 4.1  CL 103 102  CO2 33* 31  GLUCOSE 92 89  BUN 5* 9  CREATININE 0.71 0.74  CALCIUM 9.2 10.1   LFT  Basename 09/01/11 0456  PROT 6.3  ALBUMIN 3.4*  AST 69*  ALT 134*  ALKPHOS 81  BILITOT 0.4  BILIDIR 0.1  IBILI 0.3   PT/INR No results found for this basename: LABPROT:2,INR:2 in the last 72 hours Hepatitis Panel  Basename 08/30/11 0535  HEPBSAG NEGATIVE  HCVAB NEGATIVE  HEPAIGM NEGATIVE  HEPBIGM NEGATIVE   C-Diff No results found for this basename: CDIFFTOX:3 in the last 72 hours Fecal Lactopherrin No results found for this basename: FECLLACTOFRN in the  last 72 hours  Studies/Results: US Abdomen Limited  08/31/2011  *RADIOLOGY REPORT*  Clinical Data:  Elevated liver enzymes.  LIMITED ABDOMINAL ULTRASOUND - RIGHT UPPER QUADRANT  Comparison:  CT 08/29/2011  Findings:  Gallbladder:  Several shadowing foci within the gallbladder compatible with gallstones, the largest measuring 14 mm.  No wall thickening.  Negative sonographic Murphy's.  Common bile duct:  Normal caliber, 3 mm.  Liver:  Mild increased echotexture suggesting fatty infiltration. No biliary ductal dilatation.  IMPRESSION: Cholelithiasis.  Fatty liver.                   Original Report Authenticated By: Cyndie Chime, M.D.    Medications: I have reviewed the patient's current medications.  Assessment/Plan:   Acute colitis possible an infection or secondary to recent use of clindamycin from a nail infection. She does not appear to be toxic at this time. No fever.   Elevated transaminates.  Her transaminases are coming down. She possible could have passed a stone. Her CMB  Is normal at 3mm Recommendations: Imodium OTC 2mg  TID x 6 doses. She has a surgical consult this am.   LOS: 3 days   Yolandra Habig W 09/01/2011, 10:03 AM

## 2011-09-02 NOTE — Progress Notes (Signed)
NAME:  Angela Boyer, CANGE NO.:  [id number]  MEDICAL RECORD NO.:  [id number]  LOCATION:  A322                          FACILITY:  [facility]  PHYSICIAN:  Pao Haffey G. Renard Matter, MD   DATE OF BIRTH:  15-Mar-1961  DATE OF PROCEDURE: DATE OF DISCHARGE:                                PROGRESS NOTE   This patient continues to have fairly good nights with occasional diarrheal stools.  She is still on liquid diet.  She was admitted with colitis of questionable etiology, possibly secondary to his previous antibiotics.  She does have a history of hypertension, insomnia.  She does have gallstones, which continues to cause some right upper quadrant pain.  She may have passed a small stones and biliary-type pain and then elevation of transaminases.  She was seen in consultation by Dr. Lovell Sheehan who plans to do a cholecystectomy later.  The patient had just occasional diarrheal stool in the last 24-hour period.  OBJECTIVE:  VITAL SIGNS:  Blood pressure 124/79, respirations 20, pulse 71, temp 97.5. LUNGS:  Clear to P and A. HEART:  Regular rhythm. ABDOMEN:  The patient has slight tenderness in right upper quadrant.  ASSESSMENT:  The patient does have colitis, possibly secondary to clindamycin.  She does have gallstones and we suspect she may have passed a stone and had some biliary-type pain.  PLAN:  To advance diet.  Continue current regimen.  If all goes well, we will plan to discharge the patient tomorrow, and she will follow up with Dr. Lovell Sheehan as an outpatient.  We will plan her surgery in the next week or 2.     Sylwia Cuervo G. Renard Matter, MD     AGM/MEDQ  D:  09/02/2011  T:  09/02/2011  Job:  [id number]

## 2011-09-02 NOTE — Progress Notes (Signed)
UR Chart Review Completed  

## 2011-09-03 NOTE — Progress Notes (Signed)
NAME:  Angela Boyer, Angela Boyer NO.:  [id number]  MEDICAL RECORD NO.:  [id number]  LOCATION:                                 FACILITY:  PHYSICIAN:  Jonella Redditt G. Morine Kohlman, MD   DATE OF BIRTH:  30-May-1961  DATE OF PROCEDURE: DATE OF DISCHARGE:                                PROGRESS NOTE   This patient had a fair good night, but with exception in fact she states that she has had episodes of nausea and occasional abdominal pain.  Her diet has been advanced.  She was admitted with colitis of questionable etiology, possibly secondary to previous antibiotics.  She does have a history of hypertension, insomnia.  She does have gallstones and continues to have some right upper quadrant discomfort.  She is to be scheduled for cholecystectomy later.  OBJECTIVE:  VITAL SIGNS:  Blood pressure 109/73 respirations 19 pulse 57, temp 98. LUNGS:  Clear to P and A. HEART:  Regular rhythm. ABDOMEN:  Slight tenderness in upper abdomen and in right upper quadrant.  ASSESSMENT:  The patient does have colitis, possibly secondary to clindamycin.  She does have gallstones and may have passed one and had some biliary-type pain.  PLAN:  To continue to advance diet.  We will see how she does later today and if she become less symptomatic, we will discharge her home and she is to have cholecystectomy within the next week or 2.     Kaylynn Chamblin G. Renard Matter, MD     AGM/MEDQ  D:  09/03/2011  T:  09/03/2011  Job:  [id number]

## 2011-09-04 NOTE — Discharge Summary (Signed)
NAME:  LAQUINTA, HAZELL NO.:  [id number]  MEDICAL RECORD NO.:  [id number]  LOCATION:  A322                          FACILITY:  [facility]  PHYSICIAN:  Rajveer Handler G. Jadalyn Oliveri, MD   DATE OF BIRTH:  1961/03/21  DATE OF ADMISSION:  08/29/2011 DATE OF DISCHARGE:  10/12/2012LH                              DISCHARGE SUMMARY   DIAGNOSES:  Colitis of questionable etiology, cholelithiasis with passage of gallstone, essential hypertension, burning tongue syndrome, reflux esophagitis, insomnia, status post clipping of 2 cerebral aneurysms, status post hysterectomy ovarian cyst unchanged since last CT.  Condition stable and improved at time of her discharge.  This 50 year old female presented to the emergency room with severe abdominal pain for about a week.  She has had regular bowel movements but occasional diarrhea.  She had been hospitalized twice with ischemic colitis, first time at [facility] where she was visiting grandchildren and second time at [facility]. Gastroenterologist is Dr. Karilyn Cota.  She had a brain aneurysm within the last 10 years and was operated at [facility] in [place].  She had 2 aneurysms at that time, which was treated.  PHYSICAL EXAMINATION:  VITAL SIGNS:  Blood pressure 123/78, respirations 18, pulse 61. HEENT:  Eyes:  PERRLA.  TM negative.  Oropharynx benign. NECK:  Supple.  No JVD or thyroid abnormalities. HEART:  Regular rhythm.  No murmurs. LUNGS:  Clear to P and A. ABDOMEN:  No palpable organs or masses, but she did have some tenderness in the epigastrium and right upper quadrant as well.  LABORATORY DATA:  CBC on admission:  WBC 8100 with hemoglobin 14.8, hematocrit 43.7.  Subsequent CBC on February 02, 2011, WBC 9900 with hemoglobin 13.5, hematocrit 39.9.  Lactic acid 0.9.  Chemistries on admission:  Sodium 142, potassium 4.1, chloride 103, CO2 33, glucose 92, BUN 5, creatinine 0.71.  Subsequent chemistries:  Sodium  140, potassium 3.5, chloride 106, CO2 27, glucose 96, BUN 12, creatinine 0.91.  Liver enzymes on admission:  Total protein 6.3, albumin 3.4, AST 69, ALT 134. Subsequent liver enzymes:  Total protein 6.1, albumin 3.3, AST 29, ALT 25.  C. difficile negative.  Hepatitis panel negative.  Urinalysis negative.  X-RAYS:  CT of the abdomen done on admission showed mild colitis involving transverse colon and left colon.  No complicating features identified.  No evidence of perforation, abscess, or bowel obstruction. Ultrasound of the abdomen showed evidence of cholelithiasis, mild increased echotexture suggesting fatty infiltration.  No biliary ductal dilatation.  HOSPITAL COURSE:  The patient at the time of admission was placed on normal saline.  She was continued on the following medications. 1. Amlodipine 10 mg daily. 2. Benazepril 20 mg daily. 3. Cipro 500 mg b.i.d. 4. Clonazepam 0.5 mg daily. 5. Neurontin 600 mg b.i.d. 6. Flagyl 500 mg every 12 hours. 7. Mycolog cream b.i.d. 8. Pantoprazole 80 mg daily. 9. Zolpidem 10 mg daily.  The patient slowly improved throughout her hospitalization.  She was seen in consultation by Gastroenterology Service Dr. Karilyn Cota.  The patient had multiple episodes of diarrhea while in hospital setting and tenderness over lower abdomen.  She is felt to have acute colitis possibly secondary to recent use  of clindamycin.  Towards the latter part of hospital stay, she improved, continued to have upper abdominal pain.  A ultrasound of her gallbladder showed evidence of cholelithiasis.  It is felt she might have passed a stone during her hospital stay.  She was seen in consultation by Surgical Service Dr. Lovell Sheehan who felt that she should have cholecystectomy but this was planned as an outpatient following her current hospitalization.  The patient was discharged on September 04, 2011 on following medications: 1. Amlodipine 10 mg daily. 2. Benazepril 20 mg  daily. 3. Cipro 500 mg b.i.d. 4. Clonazepam 0.5 mg daily. 5. Neurontin 600 mg b.i.d. 6. Flagyl 500 mg every 12 hours. 7. Nystatin triamcinolone cream applied b.i.d. 8. Pantoprazole 80 mg daily. 9. Zolpidem 10 mg at bedtime.     Srah Ake G. Renard Matter, MD     AGM/MEDQ  D:  09/04/2011  T:  09/04/2011  Job:  [id number]

## 2011-09-04 NOTE — Progress Notes (Signed)
PIV removed without complaint, patient hemodynamically stable upon discharge. Patient verbalizes understanding of discharge instructions, medications and follow up appointments. Patient escorted out by staff, transported by family.

## 2011-09-07 LAB — URINALYSIS, ROUTINE W REFLEX MICROSCOPIC
Bilirubin Urine: NEGATIVE
Glucose, UA: NEGATIVE
Hgb urine dipstick: NEGATIVE
Ketones, ur: NEGATIVE
Protein, ur: NEGATIVE
pH: 6.5

## 2011-09-07 LAB — DIFFERENTIAL
Basophils Relative: 1
Eosinophils Absolute: 0.3
Lymphs Abs: 2.7
Monocytes Relative: 12 — ABNORMAL HIGH
Neutro Abs: 4.6
Neutrophils Relative %: 54

## 2011-09-07 LAB — COMPREHENSIVE METABOLIC PANEL
ALT: 14
Alkaline Phosphatase: 53
BUN: 7
CO2: 26
Calcium: 8.6
GFR calc non Af Amer: >60
Glucose, Bld: 104 — ABNORMAL HIGH
Total Protein: 5.8 — ABNORMAL LOW

## 2011-09-07 LAB — CBC
HCT: 31.9 — ABNORMAL LOW
Hemoglobin: 10.4 — ABNORMAL LOW
MCHC: 32.7
RBC: 4.05
RDW: 16.7 — ABNORMAL HIGH

## 2011-09-07 LAB — LIPASE, BLOOD: Lipase: 20

## 2011-11-06 ENCOUNTER — Other Ambulatory Visit (HOSPITAL_COMMUNITY): Payer: Self-pay | Admitting: Family Medicine

## 2011-11-06 DIAGNOSIS — Z139 Encounter for screening, unspecified: Secondary | ICD-10-CM

## 2011-11-12 ENCOUNTER — Ambulatory Visit (HOSPITAL_COMMUNITY): Payer: Medicare (Managed Care)

## 2012-02-29 ENCOUNTER — Other Ambulatory Visit (HOSPITAL_COMMUNITY): Payer: Self-pay | Admitting: Family Medicine

## 2012-02-29 DIAGNOSIS — R1031 Right lower quadrant pain: Secondary | ICD-10-CM

## 2012-03-01 ENCOUNTER — Other Ambulatory Visit (HOSPITAL_COMMUNITY): Payer: Self-pay | Admitting: Family Medicine

## 2012-03-01 DIAGNOSIS — R1031 Right lower quadrant pain: Secondary | ICD-10-CM

## 2012-03-03 ENCOUNTER — Ambulatory Visit (HOSPITAL_COMMUNITY): Payer: Medicare Other

## 2012-03-14 ENCOUNTER — Encounter (INDEPENDENT_AMBULATORY_CARE_PROVIDER_SITE_OTHER): Payer: Self-pay | Admitting: Internal Medicine

## 2012-03-14 ENCOUNTER — Ambulatory Visit (INDEPENDENT_AMBULATORY_CARE_PROVIDER_SITE_OTHER): Payer: Medicare Other | Admitting: Internal Medicine

## 2012-03-14 DIAGNOSIS — F411 Generalized anxiety disorder: Secondary | ICD-10-CM

## 2012-03-14 DIAGNOSIS — G8929 Other chronic pain: Secondary | ICD-10-CM | POA: Insufficient documentation

## 2012-03-14 DIAGNOSIS — K559 Vascular disorder of intestine, unspecified: Secondary | ICD-10-CM | POA: Insufficient documentation

## 2012-03-14 DIAGNOSIS — R1011 Right upper quadrant pain: Secondary | ICD-10-CM

## 2012-03-14 DIAGNOSIS — I10 Essential (primary) hypertension: Secondary | ICD-10-CM | POA: Insufficient documentation

## 2012-03-14 DIAGNOSIS — F419 Anxiety disorder, unspecified: Secondary | ICD-10-CM | POA: Insufficient documentation

## 2012-03-14 NOTE — Patient Instructions (Signed)
Abdominal US and CMet

## 2012-03-14 NOTE — Progress Notes (Addendum)
Subjective:     Patient ID: Angela Boyer, female   DOB: 1961/05/17, 51 y.o.   MRN: [id number]  HPI Referred to our office by Dr. Renard Matter. She was c/o pain rt upper quadrant and sometimes radiates down into her rt lower quadrant. She tells me she has a hx of an ovarian cyst.  This pain comes and goes.  She also will have on her left upper abdomen also. She saw Dr. Renard Matter last week for this. No pain today.  Appetite is good. No weight loss.  BM x 1 about twice a week. No melena or bright red rectal bleeding. Last colonoscopy one year ago by Dr. Randa Evens which was normal. Hx of polyps(tubularvillous adenoma)  Hx of colitis and ischemic colitis. Cholecystectomy 08/2011 for gallstones in [place]. She had an ERCP 2 days after surgery fpr stones in the CBD.   Review of Systems see hpi Current Outpatient Prescriptions  Medication Sig Dispense Refill  . amLODipine (NORVASC) 10 MG tablet Take 10 mg by mouth daily.        . benazepril (LOTENSIN) 20 MG tablet Take 20 mg by mouth daily.        . clonazePAM (KLONOPIN) 0.5 MG tablet Take 0.5 mg by mouth 2 (two) times daily.        Marland Kitchen gabapentin (NEURONTIN) 600 MG tablet Take 600 mg by mouth 2 (two) times daily.        Marland Kitchen omeprazole (PRILOSEC) 40 MG capsule Take 40 mg by mouth daily.        Marland Kitchen oxyCODONE-acetaminophen (PERCOCET) 5-325 MG per tablet Take 1 tablet by mouth 3 (three) times daily as needed. pain       . zolpidem (AMBIEN) 10 MG tablet Take 10 mg by mouth at bedtime.         Past Medical History  Diagnosis Date  . Colitis   . Ischemic colitis, enteritis, or enterocolitis   . Hypertension   . GERD (gastroesophageal reflux disease)   . Anxiety    Past Surgical History  Procedure Date  . Abdominal hysterectomy   . Brain surgery   . Appendectomy    Family Status  Relation Status Death Age  . Mother Alive     CABG, CAD, Stents in legs, Breast cancer  . Father Alive     diabetes, hypertension, high cholesterol  . Brother Alive     overweight, hypertension   No Known Allergies      Objective:   Physical Exam  Filed Vitals:   03/14/12 1553  Height: 5\' 3"  (1.6 m)  Weight: 159 lb 9.6 oz (72.394 kg)  Alert and oriented. Skin warm and dry. Oral mucosa is moist.   . Sclera anicteric, conjunctivae is pink. Thyroid not enlarged. No cervical lymphadenopathy. Lungs clear. Heart regular rate and rhythm.  Abdomen is soft. Bowel sounds are positive. No hepatomegaly. No abdominal masses felt. Slight tenderness rt lower quadrant  No edema to lower extremities. Patient is alert and oriented.       Assessment:   Rt upper quadrant pain which has resolved. She may have possible passed a stone from the CBD. She also c/o rt lower quadrant pain and is scheduled for a transvaginal US.     Plan:   Abdominal US to rule of stone in CBD. Cmet..     If pain returns will repeat a Cmet on her

## 2012-03-15 ENCOUNTER — Other Ambulatory Visit (HOSPITAL_COMMUNITY): Payer: Self-pay | Admitting: Family Medicine

## 2012-03-15 ENCOUNTER — Ambulatory Visit (HOSPITAL_COMMUNITY)
Admission: RE | Admit: 2012-03-15 | Discharge: 2012-03-15 | Disposition: A | Discharge status: Home / Self Care | Payer: Medicare Other | Source: Ambulatory Visit | Attending: Family Medicine | Admitting: Family Medicine

## 2012-03-15 ENCOUNTER — Ambulatory Visit (HOSPITAL_COMMUNITY)
Admission: RE | Admit: 2012-03-15 | Discharge: 2012-03-15 | Disposition: A | Discharge status: Home / Self Care | Payer: Medicare Other | Source: Ambulatory Visit | Attending: Internal Medicine | Admitting: Internal Medicine

## 2012-03-15 DIAGNOSIS — R1011 Right upper quadrant pain: Secondary | ICD-10-CM | POA: Insufficient documentation

## 2012-03-15 DIAGNOSIS — R1031 Right lower quadrant pain: Secondary | ICD-10-CM

## 2012-03-15 DIAGNOSIS — Z139 Encounter for screening, unspecified: Secondary | ICD-10-CM

## 2012-03-15 DIAGNOSIS — K7689 Other specified diseases of liver: Secondary | ICD-10-CM | POA: Insufficient documentation

## 2012-03-15 DIAGNOSIS — N949 Unspecified condition associated with female genital organs and menstrual cycle: Secondary | ICD-10-CM | POA: Insufficient documentation

## 2012-03-15 DIAGNOSIS — Z9089 Acquired absence of other organs: Secondary | ICD-10-CM | POA: Insufficient documentation

## 2012-03-15 LAB — COMPREHENSIVE METABOLIC PANEL
ALT: 82 U/L — ABNORMAL HIGH (ref 0–35)
Albumin: 4.4 g/dL (ref 3.5–5.2)
Alkaline Phosphatase: 97 U/L (ref 39–117)
Glucose, Bld: 90 mg/dL (ref 70–99)
Potassium: 4.8 mEq/L (ref 3.5–5.3)
Sodium: 139 mEq/L (ref 135–145)
Total Bilirubin: 0.3 mg/dL (ref 0.3–1.2)
Total Protein: 7 g/dL (ref 6.0–8.3)

## 2012-03-22 ENCOUNTER — Ambulatory Visit (HOSPITAL_COMMUNITY): Payer: Medicare Other

## 2012-03-22 ENCOUNTER — Other Ambulatory Visit (HOSPITAL_COMMUNITY): Payer: Medicare Other

## 2012-03-22 ENCOUNTER — Encounter (INDEPENDENT_AMBULATORY_CARE_PROVIDER_SITE_OTHER): Payer: Self-pay

## 2012-03-24 ENCOUNTER — Ambulatory Visit (HOSPITAL_COMMUNITY)
Admission: RE | Admit: 2012-03-24 | Discharge: 2012-03-24 | Disposition: A | Discharge status: Home / Self Care | Payer: Medicare (Managed Care) | Source: Ambulatory Visit | Attending: Family Medicine | Admitting: Family Medicine

## 2012-03-24 DIAGNOSIS — Z1231 Encounter for screening mammogram for malignant neoplasm of breast: Secondary | ICD-10-CM | POA: Insufficient documentation

## 2012-03-24 DIAGNOSIS — Z139 Encounter for screening, unspecified: Secondary | ICD-10-CM

## 2012-03-31 ENCOUNTER — Other Ambulatory Visit: Payer: Self-pay | Admitting: Family Medicine

## 2012-03-31 DIAGNOSIS — R928 Other abnormal and inconclusive findings on diagnostic imaging of breast: Secondary | ICD-10-CM

## 2012-04-06 ENCOUNTER — Ambulatory Visit
Admission: RE | Admit: 2012-04-06 | Discharge: 2012-04-06 | Disposition: A | Discharge status: Home / Self Care | Payer: Medicare (Managed Care) | Source: Ambulatory Visit | Attending: Family Medicine | Admitting: Family Medicine

## 2012-04-06 ENCOUNTER — Other Ambulatory Visit: Payer: Self-pay | Admitting: Family Medicine

## 2012-04-06 DIAGNOSIS — R928 Other abnormal and inconclusive findings on diagnostic imaging of breast: Secondary | ICD-10-CM

## 2012-04-06 HISTORY — PX: BREAST BIOPSY: SHX20

## 2012-04-07 ENCOUNTER — Other Ambulatory Visit: Payer: Self-pay | Admitting: Family Medicine

## 2012-04-07 DIAGNOSIS — C50911 Malignant neoplasm of unspecified site of right female breast: Secondary | ICD-10-CM

## 2012-04-12 ENCOUNTER — Other Ambulatory Visit (INDEPENDENT_AMBULATORY_CARE_PROVIDER_SITE_OTHER): Payer: Self-pay | Admitting: Surgery

## 2012-04-12 ENCOUNTER — Ambulatory Visit (HOSPITAL_COMMUNITY)
Admission: RE | Admit: 2012-04-12 | Discharge: 2012-04-12 | Disposition: A | Discharge status: Home / Self Care | Payer: Medicare (Managed Care) | Source: Ambulatory Visit | Attending: Family Medicine | Admitting: Family Medicine

## 2012-04-12 DIAGNOSIS — C50919 Malignant neoplasm of unspecified site of unspecified female breast: Secondary | ICD-10-CM | POA: Insufficient documentation

## 2012-04-12 DIAGNOSIS — C50911 Malignant neoplasm of unspecified site of right female breast: Secondary | ICD-10-CM

## 2012-04-12 MED ORDER — GADOBENATE DIMEGLUMINE 529 MG/ML IV SOLN
15.0000 mL | Freq: Once | INTRAVENOUS | Status: AC | PRN
  Administered 2012-04-12: 15 mL via INTRAVENOUS

## 2012-04-14 ENCOUNTER — Telehealth: Payer: Self-pay | Admitting: *Deleted

## 2012-04-14 ENCOUNTER — Other Ambulatory Visit: Payer: Self-pay | Admitting: *Deleted

## 2012-04-14 DIAGNOSIS — C50419 Malignant neoplasm of upper-outer quadrant of unspecified female breast: Secondary | ICD-10-CM | POA: Insufficient documentation

## 2012-04-14 NOTE — Telephone Encounter (Signed)
Confirmed [facility] for 04/20/12 at 1200 .  Instructions and contact information given.  Emailed new pt letter and intake form per pt request. 

## 2012-04-20 ENCOUNTER — Ambulatory Visit: Payer: Medicare (Managed Care) | Attending: General Surgery | Admitting: Physical Therapy

## 2012-04-20 ENCOUNTER — Ambulatory Visit: Payer: Medicare Other

## 2012-04-20 ENCOUNTER — Encounter: Payer: Self-pay | Admitting: *Deleted

## 2012-04-20 ENCOUNTER — Encounter (INDEPENDENT_AMBULATORY_CARE_PROVIDER_SITE_OTHER): Payer: Self-pay | Admitting: General Surgery

## 2012-04-20 ENCOUNTER — Other Ambulatory Visit: Payer: Medicare Other

## 2012-04-20 ENCOUNTER — Ambulatory Visit
Admission: RE | Admit: 2012-04-20 | Discharge: 2012-04-20 | Disposition: A | Discharge status: Home / Self Care | Payer: Medicare Other | Source: Ambulatory Visit | Attending: Radiation Oncology | Admitting: Radiation Oncology

## 2012-04-20 ENCOUNTER — Telehealth: Payer: Self-pay | Admitting: Oncology

## 2012-04-20 ENCOUNTER — Ambulatory Visit (HOSPITAL_BASED_OUTPATIENT_CLINIC_OR_DEPARTMENT_OTHER): Payer: Medicare Other | Admitting: General Surgery

## 2012-04-20 ENCOUNTER — Ambulatory Visit (HOSPITAL_BASED_OUTPATIENT_CLINIC_OR_DEPARTMENT_OTHER): Payer: Medicare Other | Admitting: Oncology

## 2012-04-20 ENCOUNTER — Encounter: Payer: Self-pay | Admitting: Oncology

## 2012-04-20 VITALS — BP 112/70 | HR 70 | Temp 97.9°F | Ht 64.0 in | Wt 159.2 lb

## 2012-04-20 DIAGNOSIS — C50419 Malignant neoplasm of upper-outer quadrant of unspecified female breast: Secondary | ICD-10-CM

## 2012-04-20 DIAGNOSIS — IMO0001 Reserved for inherently not codable concepts without codable children: Secondary | ICD-10-CM | POA: Insufficient documentation

## 2012-04-20 DIAGNOSIS — C50919 Malignant neoplasm of unspecified site of unspecified female breast: Secondary | ICD-10-CM | POA: Insufficient documentation

## 2012-04-20 DIAGNOSIS — Z170 Estrogen receptor positive status [ER+]: Secondary | ICD-10-CM

## 2012-04-20 DIAGNOSIS — R293 Abnormal posture: Secondary | ICD-10-CM | POA: Insufficient documentation

## 2012-04-20 LAB — COMPREHENSIVE METABOLIC PANEL
ALT: 46 U/L — ABNORMAL HIGH (ref 0–35)
AST: 37 U/L (ref 0–37)
Alkaline Phosphatase: 91 U/L (ref 39–117)
Sodium: 143 mEq/L (ref 135–145)
Total Bilirubin: 0.4 mg/dL (ref 0.3–1.2)
Total Protein: 6.9 g/dL (ref 6.0–8.3)

## 2012-04-20 LAB — CBC WITH DIFFERENTIAL/PLATELET
EOS%: 1.2 % (ref 0.0–7.0)
Eosinophils Absolute: 0.1 10*3/uL (ref 0.0–0.5)
MCV: 95 fL (ref 79.5–101.0)
MONO%: 6.2 % (ref 0.0–14.0)
NEUT#: 8.2 10*3/uL — ABNORMAL HIGH (ref 1.5–6.5)
RBC: 4.48 10*6/uL (ref 3.70–5.45)
RDW: 13.6 % (ref 11.2–14.5)
nRBC: 0 % (ref 0–0)

## 2012-04-20 NOTE — Progress Notes (Signed)
[facility] Radiation Oncology NEW PATIENT EVALUATION  Name: Angela Boyer MRN: [id number]  Date:   04/20/2012           DOB: 27-Aug-1961   CC: Alice Reichert, MD, MD  Ernestene Mention, MD    REFERRING PHYSICIAN: Ernestene Mention, MD   DIAGNOSIS: The encounter diagnosis was Cancer of upper-outer quadrant of female breast.    HISTORY OF PRESENT ILLNESS:  Angela Boyer is a 51 y.o. female who is seen today in multidisciplinary breast clinic for her recent diagnosis of DCIS of the right breast. The patient was found to have some suspicious findings on mammogram recently. This revealed a small 7 mm cluster of microcalcifications within the right breast at the 11:00 position. 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 Receptor studies have indicated that the tumor is ER positive and PR positive. The patient proceeded to undergo a MRI scan of the breasts bilaterally. This revealed some postbiopsy changes within the right breast with no mass or abnormal enhancement suspicious of met malignancy within either breast and no adenopathy present. Patient therefore appears to have an early tumor consistent with DCIS of the right breast and she is seen today for possible consideration of her treatment options. She is also being seen by Dr. Derrell Lolling today with regards to surgery and Dr. Darnelle Catalan with regards to systemic treatment.  PREVIOUS RADIATION THERAPY: No   PAST MEDICAL HISTORY:  has a past medical history of Colitis; Ischemic colitis, enteritis, or enterocolitis; Hypertension; GERD (gastroesophageal reflux disease); Anxiety; Breast cancer; H/O colonoscopy (2012); Night sweats; Wears glasses; Depression; and Hot flashes.     PAST SURGICAL HISTORY:  Past Surgical History  Procedure Date  . Abdominal hysterectomy   . Brain surgery   . Appendectomy   . Cholecystectomy       FAMILY HISTORY: family history includes Breast cancer in her mother and Melanoma in her father.   SOCIAL HISTORY:  reports that she has never smoked. She has never used smokeless tobacco. She reports that she does not drink alcohol or use illicit drugs.   ALLERGIES: Review of patient's allergies indicates no known allergies.   MEDICATIONS:  Current Outpatient Prescriptions  Medication Sig Dispense Refill  . amLODipine (NORVASC) 10 MG tablet Take 10 mg by mouth daily.        . benazepril (LOTENSIN) 20 MG tablet Take 20 mg by mouth daily.        . clonazePAM (KLONOPIN) 0.5 MG tablet Take 0.5 mg by mouth 2 (two) times daily.        Marland Kitchen gabapentin (NEURONTIN) 600 MG tablet Take 600 mg by mouth 2 (two) times daily.        Marland Kitchen omeprazole (PRILOSEC) 40 MG capsule Take 40 mg by mouth daily.        Marland Kitchen oxyCODONE-acetaminophen (PERCOCET) 5-325 MG per tablet Take 1 tablet by mouth 3 (three) times daily as needed. pain       . zolpidem (AMBIEN) 10 MG tablet Take 10 mg by mouth at bedtime.           REVIEW OF SYSTEMS:  A comprehensive review of systems was negative. except for hot flashes, anxiety, and dry tongue.    PHYSICAL EXAM:  vitals were not taken for this visit.  There were no vitals taken for this visit. General: Well-developed, in no acute distress HEENT: Normocephalic, atraumatic; oral cavity  clear, no lesions on the tongue Neck: Supple without any lymphadenopathy Cardiovascular: Regular rate and rhythm Respiratory: Clear to auscultation bilaterally Breasts: A biopsy site is present within the right breast with some underlying post biopsy change. No other suspicious findings within the right breast and no axillary adenopathy on this side. Left breast and left axilla negative clinically. GI: Soft, nontender, normal bowel sounds Extremities: No edema present Neuro: No focal deficits   LABORATORY DATA:  Lab Results  Component Value Date   WBC 10.6* 04/20/2012   HGB 14.3  04/20/2012   HCT 42.5 04/20/2012   MCV 95.0 04/20/2012   PLT 216 04/20/2012   Lab Results  Component Value Date   NA 143 04/20/2012   K 4.2 04/20/2012   CL 105 04/20/2012   CO2 28 04/20/2012   Lab Results  Component Value Date   ALT 46* 04/20/2012   AST 37 04/20/2012   ALKPHOS 91 04/20/2012   BILITOT 0.4 04/20/2012      IMPRESSION: Pleasant 51 year old female with a recent diagnosis of DCIS of the right breast involving the upper outer quadrant.   PLAN: We discussed in breast conference and in multidisciplinary clinic the fact that we feel that the patient is a good candidate for breast conservation treatment. She is interested in this and she is discussing a possible lumpectomy with Dr. Derrell Lolling today. I did discuss with her my recommendation for her to then proceed with adjuvant radiotherapy. This would consist of 6-1/2 weeks of radiation treatment. This would be expected to improve her chances at local control. All of her questions were answered. We did discuss the possible side effects and treatment as well. Most commonly this would include the possibility of fatigue as well as some skin irritation, both increasing towards the end of treatment potentially.  I would therefore like to see the patient back for followup after surgery. If she desires treatment in the new then this can certainly be arranged, and I would be happy to see her at this facility as well to further coordinate her treatment at this site.   Dr. Darnelle Catalan is also to seeing her today to discuss possible systemic treatment, and it may be appropriate for her based on their conversation to proceed with hormonal treatment after her radiation.

## 2012-04-20 NOTE — Patient Instructions (Signed)
You have been diagnosed with ductal carcinoma in situ in the upper outer quadrant of the right breast. This is a small area. There is no evidence of cancer elsewhere.  You'll be scheduled for right partial mastectomy with needle localization, right axillary sentinel node biopsy.    Lumpectomy, Breast Conserving Surgery A lumpectomy is breast surgery that removes only part of the breast. Another name used may be partial mastectomy. The amount removed varies. Make sure you understand how much of your breast will be removed. Reasons for a lumpectomy:  Any solid breast mass.   Grouped significant nodularity that may be confused with a solitary breast mass.  Lumpectomy is the most common form of breast cancer surgery today. The surgeon removes the portion of your breast which contains the tumor (cancer). This is the lump. Some normal tissue around the lump is also removed to be sure that all the tumor has been removed.  If cancer cells are found in the margins where the breast tissue was removed, your surgeon will do more surgery to remove the remaining cancer tissue. This is called re-excision surgery. Radiation and/or chemotherapy treatments are often given following a lumpectomy to kill any cancer cells that could possibly remain.  REASONS YOU MAY NOT BE ABLE TO HAVE BREAST CONSERVING SURGERY:  The tumor is located in more than one place.   Your breast is small and the tumor is large so the breast would be disfigured.   The entire tumor removal is not successful with a lumpectomy.   You cannot commit to a full course of chemotherapy, radiation therapy or are pregnant and cannot have radiation.   You have previously had radiation to the breast to treat cancer.  HOW A LUMPECTOMY IS PERFORMED If overnight nursing is not required following a biopsy, a lumpectomy can be performed as a same-day surgery. This can be done in a hospital, clinic, or surgical center. The anesthesia used will depend on  your surgeon. They will discuss this with you. A general anesthetic keeps you sleeping through the procedure. LET YOUR CAREGIVERS KNOW ABOUT THE FOLLOWING:  Allergies   Medications taken including herbs, eye drops, over the counter medications, and creams.   Use of steroids (by mouth or creams)   Previous problems with anesthetics or Novocaine.   Possibility of pregnancy, if this applies   History of blood clots (thrombophlebitis)   History of bleeding or blood problems.   Previous surgery   Other health problems  BEFORE THE PROCEDURE You should be present one hour prior to your procedure unless directed otherwise.  AFTER THE PROCEDURE  After surgery, you will be taken to the recovery area where a nurse will watch and check your progress. Once you're awake, stable, and taking fluids well, barring other problems you will be allowed to go home.   Ice packs applied to your operative site may help with discomfort and keep the swelling down.   A small rubber drain may be placed in the breast for a couple of days to prevent a hematoma from developing in the breast.   A pressure dressing may be applied for 24 to 48 hours to prevent bleeding.   Keep the wound dry.   You may resume a normal diet and activities as directed. Avoid strenuous activities affecting the arm on the side of the biopsy site such as tennis, swimming, heavy lifting (more than 10 pounds) or pulling.   Bruising in the breast is normal following this procedure.  Wearing a bra - even to bed - may be more comfortable and also help keep the dressing on.   Change dressings as directed.   Only take over-the-counter or prescription medicines for pain, discomfort, or fever as directed by your caregiver.  Call for your results as instructed by your surgeon. Remember it is your responsibility to get the results of your lumpectomy if your surgeon asked you to follow-up. Do not assume everything is fine if you have not  heard from your caregiver. SEEK MEDICAL CARE IF:   There is increased bleeding (more than a small spot) from the wound.   You notice redness, swelling, or increasing pain in the wound.   Pus is coming from wound.   An unexplained oral temperature above 102 F (38.9 C) develops.   You notice a foul smell coming from the wound or dressing.  SEEK IMMEDIATE MEDICAL CARE IF:   You develop a rash.   You have difficulty breathing.   You have any allergic problems.  Document Released: 12/21/2006 Document Revised: 10/29/2011 Document Reviewed: 03/24/2007 [facility] Patient Information 2012 [place], [place].

## 2012-04-20 NOTE — Progress Notes (Signed)
Mailed after appt letter to pt. 

## 2012-04-20 NOTE — Progress Notes (Signed)
New patient today, patient has insurance patient was not in need of financial assistance at this time, gave patient contact information.

## 2012-04-20 NOTE — Progress Notes (Addendum)
Patient ID: Angela Boyer, female   DOB: Oct 18, 1961, 51 y.o.   MRN: [id number]  No chief complaint on file.   HPI Angela Boyer is a 51 y.o. female.  She was referred by Dr. Anselmo Pickler at the breast center [facility] for evaluation and management of a localized area of DCIS in the upper outer quadrant of the right breast, receptor positive.  The patient was discussed in breast conference this morning and is being seen in the breast [facility] today by me, Dr. Darnelle Boyer, and Dr. Mitzi Boyer.  The patient has not had a breast problem before. Screening mammogram showed a focal area of microcalcifications in the upper outer quadrant of the right breast at the 11:00 position. The left breast looked normal. Image guided biopsy of these calcifications shows high-grade DCIS, ER and PR strongly positive. MRI shows only biopsy changes in the right breast, otherwise negative.  Family history is significant for her mother having breast cancer at age 76. Otherwise family history is noncontributory.  His past history is significant for emergency craniotomy for cerebral aneurysm, hysterectomy, appendectomy, laparotomy for ischemic colitis, hypertension, dry tongue syndrome, and anxiety.  Socially she and her husband have houses in [place], [place] and  in [place], [place].  She is interested in breast conservation. HPI  Past Medical History  Diagnosis Date  . Colitis   . Ischemic colitis, enteritis, or enterocolitis   . Hypertension   . GERD (gastroesophageal reflux disease)   . Anxiety   . Breast cancer   . H/O colonoscopy 2012  . Night sweats   . Wears glasses   . Depression   . Hot flashes     Past Surgical History  Procedure Date  . Abdominal hysterectomy   . Brain surgery   . Appendectomy   . Cholecystectomy     Family History  Problem Relation Age of Onset  . Breast cancer Mother   . Melanoma Father     Social History History  Substance Use Topics  . Smoking status: Never  Smoker   . Smokeless tobacco: Never Used  . Alcohol Use: No    No Known Allergies  Current Outpatient Prescriptions  Medication Sig Dispense Refill  . amLODipine (NORVASC) 10 MG tablet Take 10 mg by mouth daily.        . benazepril (LOTENSIN) 20 MG tablet Take 20 mg by mouth daily.        . clonazePAM (KLONOPIN) 0.5 MG tablet Take 0.5 mg by mouth 2 (two) times daily.        Marland Kitchen gabapentin (NEURONTIN) 600 MG tablet Take 600 mg by mouth 2 (two) times daily.        Marland Kitchen omeprazole (PRILOSEC) 40 MG capsule Take 40 mg by mouth daily.        Marland Kitchen oxyCODONE-acetaminophen (PERCOCET) 5-325 MG per tablet Take 1 tablet by mouth 3 (three) times daily as needed. pain       . zolpidem (AMBIEN) 10 MG tablet Take 10 mg by mouth at bedtime.          Review of Systems Review of Systems  Constitutional: Negative for fever, chills and unexpected weight change.  HENT: Negative for hearing loss, congestion, sore throat, trouble swallowing and voice change.        Dry tongue  Eyes: Negative for visual disturbance.  Respiratory: Negative for cough and wheezing.   Cardiovascular: Negative for chest pain, palpitations and leg swelling.       Hot flashes  Gastrointestinal: Positive for constipation. Negative for nausea, vomiting, abdominal pain, diarrhea, blood in stool, abdominal distention and anal bleeding.       GERD  Genitourinary: Negative for hematuria, vaginal bleeding and difficulty urinating.       Night sweats  Musculoskeletal: Negative for arthralgias.  Skin: Negative for rash and wound.  Neurological: Negative for seizures, syncope and headaches.  Hematological: Negative for adenopathy. Does not bruise/bleed easily.  Psychiatric/Behavioral: Negative for confusion. The patient is nervous/anxious.     There were no vitals taken for this visit.  Physical Exam Physical Exam  Constitutional: She is oriented to person, place, and time. She appears well-developed and well-nourished. No distress.  HENT:   Head: Normocephalic and atraumatic.  Nose: Nose normal.  Mouth/Throat: No oropharyngeal exudate.  Eyes: Conjunctivae and EOM are normal. Pupils are equal, round, and reactive to light. Left eye exhibits no discharge. No scleral icterus.  Neck: Neck supple. No JVD present. No tracheal deviation present. No thyromegaly present.  Cardiovascular: Normal rate, regular rhythm, normal heart sounds and intact distal pulses.   No murmur heard. Pulmonary/Chest: Effort normal and breath sounds normal. No respiratory distress. She has no wheezes. She has no rales. She exhibits no tenderness.         Small biopsy site right breast, upper outer quadrant. Minimal bruising. No mass bilaterally. No adenopathy bilaterally. No skin changes bilaterally.  Abdominal: Soft. Bowel sounds are normal. She exhibits no distension and no mass. There is no tenderness. There is no rebound and no guarding.         Multiple scars  Musculoskeletal: She exhibits no edema and no tenderness.  Lymphadenopathy:    She has no cervical adenopathy.  Neurological: She is alert and oriented to person, place, and time. She exhibits normal muscle tone. Coordination normal.  Skin: Skin is warm. No rash noted. She is not diaphoretic. No erythema. No pallor.  Psychiatric: She has a normal mood and affect. Her behavior is normal. Judgment and thought content normal.    Data Reviewed I reviewed her pathology report, her pathology slides, alternating studies, discussion and conference, coordinated care was Dr. Darnelle Boyer and Dr. Mitzi Boyer.  Assessment    Receptor positive, high-grade DCIS right breast, upper outer quadrant.  History craniotomy for cerebral aneurysm, on Medicare disability but with minimal neurologic deficit  Status post laparotomy for acute abdomen thought to be ischemic colitis possibly salmonella poisoning  Status post cholecystectomy  Status post appendectomy  Hypertension  Anxiety and depression  Status post  abdominal hysterectomy without BSO.    Plan    She is interested in breast conservation, and she is a good candidate for that. She'll be scheduled for right partial mastectomy with needle localization and we will do right sentinel lymph node biopsy because of the anatomic location of the DCIS and the possibility of microinvasive disease.  She desires adjuvant radiation therapy in [place].  She is interested in genetic counseling, but this may not be covered by insurance. Her questions will be answered by genetic counselor.  I discussed the indications and details of surgery with her and her husband. Risks and complications have been outlined, including but not limited to bleeding, infection, reoperation for positive nodes, reoperation for positive margins, skin necrosis, cosmetic deformity, and other unforeseen problems. She understands these issues. Her questions were answered. She agrees with the plan.       [name]. Derrell Lolling, M.D., [facility] General and Minimally invasive Surgery Breast and Colorectal  Surgery Office:   [phone] Pager:   [phone]  04/20/2012, 4:22 PM

## 2012-04-20 NOTE — Telephone Encounter (Signed)
lmonvm adviisng the pt of her July 2013 appt °

## 2012-04-21 ENCOUNTER — Other Ambulatory Visit (INDEPENDENT_AMBULATORY_CARE_PROVIDER_SITE_OTHER): Payer: Self-pay | Admitting: General Surgery

## 2012-04-21 ENCOUNTER — Encounter: Payer: Self-pay | Admitting: *Deleted

## 2012-04-21 DIAGNOSIS — C50419 Malignant neoplasm of upper-outer quadrant of unspecified female breast: Secondary | ICD-10-CM

## 2012-04-21 NOTE — Progress Notes (Signed)
[facility] Psychosocial Distress Screening Clinical Social Work  Clinical Social Work was referred by distress screening protocol.  The patient scored a 10 on the Psychosocial Distress Thermometer which indicates severe distress. Clinical Social Worker met with patient and patients husband in the exam room during [facility] to assess for distress and other psychosocial needs.  Pt stated she was feeling much better after talking with the physicians and receiving information on her treatment plan.  Pt stated "I am just ready to get it over with."  CSW validated the pt's feelings and concerns, and discussed the support services at [facility].  CSW provided pt with information on the [facility] patient and family support team and programs available.  CSW also discussed the reach to recovery program and benefits of connecting with a Dance movement psychotherapist.  Pt was open to the program, but was not ready to be referred at this time.  CSW encouraged pt and family to call with any additional questions or concerns.      Clinical Social Worker follow up needed: not at this time.  Tamala Julian, MSW, LCSW Clinical Social Worker [facility] [phone]

## 2012-04-21 NOTE — Progress Notes (Signed)
ID: [name]   DOB: 1961/09/08  MR#: [id number]  WUJ#:[id number]  HISTORY OF PRESENT ILLNESS: The patient (goes by "[name]") had bilateral screening mammography 0502/2013 showing microcalcifications in the right breast. Additional views were obtained of 04/06/2012 and this confirmed a 7 mm cluster of pleomorphic calcifications in the upper outer quadrant of the right breast. Biopsy of the mass was performed the same day, and showed ([id number]) ductal carcinoma in situ, high-grade, estrogen and progesterone receptor both positive at 100%.  The patient proceeded to bilateral breast MRI 04/12/2012 showing only post biopsy change, with no masses or abnormal enhancement suspicious for malignancy in either breast and no adenopathy.  INTERVAL HISTORY: The patient was seen with her husband Mitch at the multidisciplinary breast cancer clinic 04/20/2012 for a full discussion of her situation and definitive treatment plan.  REVIEW OF SYSTEMS: The patient has an active review of systems, but was not having any unusual problems at the time of her mammogram. She has a hot flashes which sometimes wake her up at night, she has neuropathy involving nerves of the tongue, which cause a chronic pain syndrome. She has to sleep on 2 pillows although she denies shortness of breath when walking up stairs. She has significant problems with heartburn and constipation, she feels do to her narcotics. She bruises easily, is anxious, depressed, and has significant problems with reflux as well as a history of panic attacks. Again these problems do not seem related to the reason for visit here today.  PAST MEDICAL HISTORY: Past Medical History  Diagnosis Date  . Colitis   . Ischemic colitis, enteritis, or enterocolitis   . Hypertension   . GERD (gastroesophageal reflux disease)   . Anxiety   . Breast cancer   . H/O colonoscopy 2012  . Night sweats   . Wears glasses   . Depression   . Hot flashes    chronic pain  syndrome, the patient receiving all pain medicines through a pain clinic in [place].  PAST SURGICAL HISTORY: Past Surgical History  Procedure Date  . Abdominal hysterectomy   . Brain surgery   . Appendectomy   . Cholecystectomy    she had a ruptured of central nervous system aneurysm approximately 12 years ago, which resulted on her being disabled. She did not have her ovaries removed at the time of her hysterectomy.  FAMILY HISTORY Family History  Problem Relation Age of Onset  . Breast cancer Mother   . Melanoma Father    The patient's parents are alive. Her mother was diagnosed with breast cancer at the age of 39. She underwent left mastectomy and is doing well currently age 78. The patient's father had what sounds like squamous cell cancers of the face secondary to sun exposure, clearly not melanoma. The patient has one brother, no sisters. There are no other cancers in the immediate family.  GYNECOLOGIC HISTORY: Menarche age 40, GX P30, with first live birth at age 22. She status post hysterectomy without salpingo-oophorectomy. She took hormone replacement for about 4 months, stopping April 2013.  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 Her husband Lanisha Stepanian (goes by Clovis Riley) is a Chiropractor. Daughter Vinetta Bergamo lives in [place] and owns a Education officer, environmental business. Daughter Casi. Caloca lives in [place] at and works at a group home for local children. Son Wenonah Milo lives in [place] and workes for the department of [employer]. The patient  has 2 grandchildren. She is not a church attender  ADVANCED DIRECTIVES: Not in place  HEALTH MAINTENANCE: History  Substance Use Topics  . Smoking status: Never Smoker   . Smokeless tobacco: Never Used  . Alcohol Use: No     Colonoscopy: 2011/[facility]  PAP: s/p hysterectomy  Bone density: never  Lipid panel:  No Known Allergies  Current  Outpatient Prescriptions  Medication Sig Dispense Refill  . amLODipine (NORVASC) 10 MG tablet Take 10 mg by mouth daily.        . benazepril (LOTENSIN) 20 MG tablet Take 20 mg by mouth daily.        . clonazePAM (KLONOPIN) 0.5 MG tablet Take 0.5 mg by mouth 2 (two) times daily.        Marland Kitchen gabapentin (NEURONTIN) 600 MG tablet Take 600 mg by mouth 2 (two) times daily.        Marland Kitchen omeprazole (PRILOSEC) 40 MG capsule Take 40 mg by mouth daily.        Marland Kitchen oxyCODONE-acetaminophen (PERCOCET) 5-325 MG per tablet Take 1 tablet by mouth 3 (three) times daily as needed. pain       . zolpidem (AMBIEN) 10 MG tablet Take 10 mg by mouth at bedtime.          OBJECTIVE: Middle-aged white woman in no acute distress Filed Vitals:   04/20/12 1256  BP: 112/70  Pulse: 70  Temp: 97.9 F (36.6 C)     Body mass index is 27.33 kg/(m^2).    ECOG FS:2  Sclerae unicteric Oropharynx clear No peripheral adenopathy Lungs no rales or rhonchi Heart regular rate and rhythm Abd benign MSK no focal spinal tenderness, no peripheral edema Neuro: nonfocal Breasts: The right breast is status post recent biopsy. I do not see any skin change or nipple change of concern and I do not palpate any focal mass. The left breast is unremarkable.  LAB RESULTS: Lab Results  Component Value Date   WBC 10.6* 04/20/2012   NEUTROABS 8.2* 04/20/2012   HGB 14.3 04/20/2012   HCT 42.5 04/20/2012   MCV 95.0 04/20/2012   PLT 216 04/20/2012      Chemistry      Component Value Date/Time   NA 143 04/20/2012 1214   K 4.2 04/20/2012 1214   CL 105 04/20/2012 1214   CO2 28 04/20/2012 1214   BUN 12 04/20/2012 1214   CREATININE 0.91 04/20/2012 1214   CREATININE 0.80 03/14/2012 1608      Component Value Date/Time   CALCIUM 9.2 04/20/2012 1214   ALKPHOS 91 04/20/2012 1214   AST 37 04/20/2012 1214   ALT 46* 04/20/2012 1214   BILITOT 0.4 04/20/2012 1214       No results found for this basename: LABCA2    No components found with this basename:  LABCA125    No results found for this basename: INR:1;PROTIME:1 in the last 168 hours  Urinalysis    Component Value Date/Time   COLORURINE YELLOW 08/29/2011 1335   APPEARANCEUR HAZY* 08/29/2011 1335   LABSPEC 1.025 08/29/2011 1335   PHURINE 6.0 08/29/2011 1335   GLUCOSEU NEGATIVE 08/29/2011 1335   HGBUR NEGATIVE 08/29/2011 1335   BILIRUBINUR NEGATIVE 08/29/2011 1335   KETONESUR TRACE* 08/29/2011 1335   PROTEINUR NEGATIVE 08/29/2011 1335   UROBILINOGEN 0.2 08/29/2011 1335   NITRITE NEGATIVE 08/29/2011 1335   LEUKOCYTESUR NEGATIVE 08/29/2011 1335    STUDIES: Mr Breast Bilateral W Wo Contrast  04/12/2012  *RADIOLOGY REPORT*  Clinical Data: Recently diagnosed right breast  ductal carcinoma in situ.  BILATERAL BREAST MRI WITH AND WITHOUT CONTRAST  Technique: Multiplanar, multisequence MR images of both breasts were obtained prior to and following the intravenous administration of 15ml of MultiHance.  Three dimensional images were evaluated at the independent DynaCad workstation.  Comparison:  Recent mammogram and biopsy examinations.  Findings: Moderate background parenchymal enhancement in both breasts.  Postbiopsy hematoma in the anterior aspect of the upper outer quadrant of the right breast with no associated mass or abnormal enhancement.  There is adjacent diffuse edema and low grade enhancement extending more inferiorly in the central portion of the breast with some anterior skin thickening and enhancement and diffuse enlargement of the breast compared to the left, not seen on the mammograms prior to the biopsy.  Pre biopsy, the right breast was slightly smaller than the left breast mammographically.  No masses or areas of abnormal enhancement in the left breast.  No abnormal appearing lymph nodes.  IMPRESSION: Post biopsy changes in the right breast with no mass or abnormal enhancement suspicious for malignancy in either breast at this time.  No adenopathy.  THREE-DIMENSIONAL MR IMAGE RENDERING ON  INDEPENDENT WORKSTATION:  Three-dimensional MR images were rendered by post-processing of the original MR data on an independent workstation.  The three- dimensional MR images were interpreted, and findings were reported in the accompanying complete MRI report for this study.  BI-RADS CATEGORY 6:  Known biopsy-proven malignancy - appropriate action should be taken.  Recommendation:  Treatment plan.  Original Report Authenticated By: Darrol Angel, M.D.   ASSESSMENT: 51 year old [place] woman status post right breast biopsy 04/06/2012 for a ductal carcinoma in situ, grade 3,  strongly estrogen and progesterone receptor positive, measuring 7 mm by mammography.  PLAN: The patient is an excellent candidate for breast conservation surgery. Depending on results she may benefit from antiestrogen therapy in addition to radiation, however the patient has "burning tongue syndrome" and is very concerned that hormonal manipulation may have started that problem (with her hysterectomy) and anti-estrogens might aggravate it.  In point of fact the benefit of antiestrogen therapy in this patient is likely to be minimal and it may be perfectly reasonable for her to forego this treatment option. We really cannot make the decision at present since we do not have the final pathology report. I have made her a return appointment with me for late July, by which time I expect she will be done with radiation (which she would like to receive in [place]). We will make a definitive decision regarding antiestrogen therapy and followup then.   Batu Cassin C    04/21/2012

## 2012-04-25 ENCOUNTER — Telehealth: Payer: Self-pay | Admitting: *Deleted

## 2012-04-25 NOTE — Telephone Encounter (Signed)
Spoke to pt concerning BMDC from 5/29.  Pt denies questions or concerns regarding dx or treatment care plan.  Confirmed surgery date and f/u with Dr. Darnelle Catalan.  Encourage pt to call with needs.  Received verbal understanding.  Contact information given.

## 2012-04-26 ENCOUNTER — Telehealth: Payer: Self-pay | Admitting: *Deleted

## 2012-04-26 NOTE — Telephone Encounter (Signed)
Pt called feeling "overwhelmed" and scared about her treatment.  I talked to her about her treatment care plan and gave emotional support.  Pt also had questions about the exercises to do after surgery.  I recommended that she attend the ABC class.  Pt relate she would like to attend.  I sent a referral to the pt and family support team for extra emotional support and to PT to discuss excises to do after surgery.  Pt denies further needs at this time.  Encourage pt to call with further concerns.  Contact information given.

## 2012-04-28 ENCOUNTER — Encounter: Payer: Self-pay | Admitting: Specialist

## 2012-04-28 NOTE — Progress Notes (Signed)
I was asked by Marianne Sofia to call Mrs. Caridi.  The patient expressed feeling overwhelmed by her recent diagnosis, her up-coming surgery, and recent care needs of her mother-in-law.  I spoke with her about the counseling available and the Breast Cancer Support Group at the [facility].  She will call me next week if she decides to take advantage of either service.

## 2012-04-29 ENCOUNTER — Encounter (HOSPITAL_BASED_OUTPATIENT_CLINIC_OR_DEPARTMENT_OTHER): Payer: Self-pay | Admitting: *Deleted

## 2012-04-29 NOTE — Progress Notes (Signed)
To come for labs,cxr,ekg,ua-all preop instructions reviewed with pt and husband-she is very emotional.

## 2012-05-02 ENCOUNTER — Other Ambulatory Visit (HOSPITAL_BASED_OUTPATIENT_CLINIC_OR_DEPARTMENT_OTHER): Payer: Medicare (Managed Care)

## 2012-05-02 ENCOUNTER — Ambulatory Visit
Admission: RE | Admit: 2012-05-02 | Discharge: 2012-05-02 | Disposition: A | Discharge status: Home / Self Care | Payer: Medicare (Managed Care) | Source: Ambulatory Visit | Attending: General Surgery | Admitting: General Surgery

## 2012-05-02 ENCOUNTER — Encounter (HOSPITAL_BASED_OUTPATIENT_CLINIC_OR_DEPARTMENT_OTHER)
Admission: RE | Admit: 2012-05-02 | Discharge: 2012-05-02 | Disposition: A | Discharge status: Home / Self Care | Payer: Medicare (Managed Care) | Source: Ambulatory Visit

## 2012-05-02 LAB — CBC
HCT: 41.6 % (ref 36.0–46.0)
MCV: 93.5 fL (ref 78.0–100.0)
RBC: 4.45 MIL/uL (ref 3.87–5.11)
RDW: 13.3 % (ref 11.5–15.5)
WBC: 8.9 10*3/uL (ref 4.0–10.5)

## 2012-05-02 LAB — URINALYSIS, ROUTINE W REFLEX MICROSCOPIC
Glucose, UA: NEGATIVE mg/dL
Hgb urine dipstick: NEGATIVE
Ketones, ur: NEGATIVE mg/dL
Protein, ur: NEGATIVE mg/dL
Urobilinogen, UA: 0.2 mg/dL (ref 0.0–1.0)

## 2012-05-02 LAB — COMPREHENSIVE METABOLIC PANEL
AST: 24 U/L (ref 0–37)
Albumin: 3.9 g/dL (ref 3.5–5.2)
Alkaline Phosphatase: 80 U/L (ref 39–117)
BUN: 14 mg/dL (ref 6–23)
CO2: 25 mEq/L (ref 19–32)
Chloride: 106 mEq/L (ref 96–112)
Creatinine, Ser: 0.84 mg/dL (ref 0.50–1.10)
GFR calc non Af Amer: 79 mL/min — ABNORMAL LOW (ref >90)
Potassium: 4.8 mEq/L (ref 3.5–5.1)
Total Bilirubin: 0.4 mg/dL (ref 0.3–1.2)

## 2012-05-02 LAB — URINE MICROSCOPIC-ADD ON

## 2012-05-03 ENCOUNTER — Telehealth (INDEPENDENT_AMBULATORY_CARE_PROVIDER_SITE_OTHER): Payer: Self-pay | Admitting: General Surgery

## 2012-05-03 LAB — CANCER ANTIGEN 27.29: CA 27.29: 20 U/mL (ref 0–39)

## 2012-05-03 NOTE — Progress Notes (Signed)
Results of UA and mirco UA given to Dr Derrell Lolling per Joylene Grapes. Results reviewed per Dr Derrell Lolling pt ok for surgery.

## 2012-05-03 NOTE — Progress Notes (Signed)
Spoke to triage nurse Britta Mccreedy RN  At [facility]. Informed her of abnormal UA and micro results. She stated that she would make sure the results have been place in Dr Priscille Kluver.

## 2012-05-03 NOTE — H&P (Signed)
COLENE MINES    MRN: [id number]   Description: 51 year old female  Provider: Ernestene Mention, MD  Department: [facility]     Diagnoses     Cancer of upper-outer quadrant of female breast   - Primary    174.4      History and Physical   Ernestene Mention, MD Patient ID: Angela Boyer, female   DOB: November 22, 1961, 51 y.o.   MRN: [id number]           HPI  Angela Boyer is a 51 y.o. female.  She was referred by Dr. Anselmo Pickler at the breast center of  [facility] for evaluation and management of a localized area of DCIS in the upper outer quadrant of the right breast, receptor positive.   The patient was discussed in breast conference this morning and is being seen in the breast [facility] today by me, Dr. Darnelle Catalan, and Dr. Mitzi Hansen.   The patient has not had a breast problem before. Screening mammogram showed a focal area of microcalcifications in the upper outer quadrant of the right breast at the 11:00 position. The left breast looked normal. Image guided biopsy of these calcifications shows high-grade DCIS, ER and PR strongly positive. MRI shows only biopsy changes in the right breast, otherwise negative.   Family history is significant for her mother having breast cancer at age 19. Otherwise family history is noncontributory.   His past history is significant for emergency craniotomy for cerebral aneurysm, hysterectomy, appendectomy, laparotomy for ischemic colitis, hypertension, dry tongue syndrome, and anxiety.   Socially she and her husband have houses in [place], [place] and  in [place], [place].   She is interested in breast conservation.      Past Medical History   Diagnosis  Date   .  Colitis     .  Ischemic colitis, enteritis, or enterocolitis     .  Hypertension     .  GERD (gastroesophageal reflux disease)     .  Anxiety     .  Breast cancer     .  H/O colonoscopy  2012   .  Night sweats     .  Wears glasses     .  Depression      .  Hot flashes         Past Surgical History   Procedure  Date   .  Abdominal hysterectomy     .  Brain surgery     .  Appendectomy     .  Cholecystectomy         Family History   Problem  Relation  Age of Onset   .  Breast cancer  Mother     .  Melanoma  Father       Social History History   Substance Use Topics   .  Smoking status:  Never Smoker    .  Smokeless tobacco:  Never Used   .  Alcohol Use:  No     No Known Allergies    Current Outpatient Prescriptions   Medication  Sig  Dispense  Refill   .  amLODipine (NORVASC) 10 MG tablet  Take 10 mg by mouth daily.           .  benazepril (LOTENSIN) 20 MG tablet  Take 20 mg by mouth daily.           .  clonazePAM (KLONOPIN) 0.5 MG tablet  Take  0.5 mg by mouth 2 (two) times daily.           Marland Kitchen  gabapentin (NEURONTIN) 600 MG tablet  Take 600 mg by mouth 2 (two) times daily.           Marland Kitchen  omeprazole (PRILOSEC) 40 MG capsule  Take 40 mg by mouth daily.           Marland Kitchen  oxyCODONE-acetaminophen (PERCOCET) 5-325 MG per tablet  Take 1 tablet by mouth 3 (three) times daily as needed. pain          .  zolpidem (AMBIEN) 10 MG tablet  Take 10 mg by mouth at bedtime.              Review of Systems   Constitutional: Negative for fever, chills and unexpected weight change.  HENT: Negative for hearing loss, congestion, sore throat, trouble swallowing and voice change.         Dry tongue  Eyes: Negative for visual disturbance.  Respiratory: Negative for cough and wheezing.   Cardiovascular: Negative for chest pain, palpitations and leg swelling.        Hot flashes  Gastrointestinal: Positive for constipation. Negative for nausea, vomiting, abdominal pain, diarrhea, blood in stool, abdominal distention and anal bleeding.        GERD  Genitourinary: Negative for hematuria, vaginal bleeding and difficulty urinating.        Night sweats  Musculoskeletal: Negative for arthralgias.  Skin: Negative for rash and wound.  Neurological:  Negative for seizures, syncope and headaches.  Hematological: Negative for adenopathy. Does not bruise/bleed easily.  Psychiatric/Behavioral: Negative for confusion. The patient is nervous/anxious.   .   Physical Exam  Constitutional: She is oriented to person, place, and time. She appears well-developed and well-nourished. No distress.  HENT:   Head: Normocephalic and atraumatic.   Nose: Nose normal.   Mouth/Throat: No oropharyngeal exudate.  Eyes: Conjunctivae and EOM are normal. Pupils are equal, round, and reactive to light. Left eye exhibits no discharge. No scleral icterus.  Neck: Neck supple. No JVD present. No tracheal deviation present. No thyromegaly present.  Cardiovascular: Normal rate, regular rhythm, normal heart sounds and intact distal pulses.    No murmur heard. Pulmonary/Chest: Effort normal and breath sounds normal. No respiratory distress. She has no wheezes. She has no rales. She exhibits no tenderness.        Small biopsy site right breast, upper outer quadrant. Minimal bruising. No mass bilaterally. No adenopathy bilaterally. No skin changes bilaterally.  Abdominal: Soft. Bowel sounds are normal. She exhibits no distension and no mass. There is no tenderness. There is no rebound and no guarding.       Multiple scars  Musculoskeletal: She exhibits no edema and no tenderness.  Lymphadenopathy:    She has no cervical adenopathy.  Neurological: She is alert and oriented to person, place, and time. She exhibits normal muscle tone. Coordination normal.  Skin: Skin is warm. No rash noted. She is not diaphoretic. No erythema. No pallor.  Psychiatric: She has a normal mood and affect. Her behavior is normal. Judgment and thought content normal.    Data Reviewed I reviewed her pathology report, her pathology slides, alternating studies, discussion and conference, coordinated care was Dr. Darnelle Catalan and Dr. Mitzi Hansen.   Assessment  Receptor positive, high-grade DCIS right  breast, upper outer quadrant.  History craniotomy for cerebral aneurysm, on Medicare disability but with minimal neurologic deficit  Status post laparotomy for acute abdomen thought to  be ischemic colitis possibly salmonella poisoning  Status post cholecystectomy  Status post appendectomy  Hypertension  Anxiety and depression  Status post abdominal hysterectomy without BSO.    Plan  She is interested in breast conservation, and she is a good candidate for that. She'll be scheduled for right partial mastectomy with needle localization and we will do right sentinel lymph node biopsy because of the anatomic location of the DCIS and the possibility of microinvasive disease.  She desires adjuvant radiation therapy in [place].  She is interested in genetic counseling, but this may not be covered by insurance. Her questions will be answered by genetic counselor.  I discussed the indications and details of surgery with her and her husband. Risks and complications have been outlined, including but not limited to bleeding, infection, reoperation for positive nodes, reoperation for positive margins, skin necrosis, cosmetic deformity, and other unforeseen problems. She understands these issues. Her questions were answered. She agrees with the plan.       [name]. Derrell Lolling, M.D., [facility] General and Minimally invasive Surgery Breast and Colorectal Surgery Office:   [phone] Pager:   [phone]

## 2012-05-03 NOTE — Telephone Encounter (Signed)
Abnormal UA

## 2012-05-04 ENCOUNTER — Encounter (HOSPITAL_BASED_OUTPATIENT_CLINIC_OR_DEPARTMENT_OTHER): Payer: Self-pay | Admitting: Anesthesiology

## 2012-05-04 ENCOUNTER — Encounter (HOSPITAL_BASED_OUTPATIENT_CLINIC_OR_DEPARTMENT_OTHER): Payer: Self-pay | Admitting: *Deleted

## 2012-05-04 ENCOUNTER — Ambulatory Visit
Admission: RE | Admit: 2012-05-04 | Discharge: 2012-05-04 | Disposition: A | Discharge status: Home / Self Care | Payer: Medicare (Managed Care) | Source: Ambulatory Visit | Attending: General Surgery | Admitting: General Surgery

## 2012-05-04 ENCOUNTER — Ambulatory Visit (HOSPITAL_COMMUNITY)
Admission: RE | Admit: 2012-05-04 | Discharge: 2012-05-04 | Disposition: A | Discharge status: Home / Self Care | Payer: Medicare (Managed Care) | Source: Ambulatory Visit | Attending: General Surgery | Admitting: General Surgery

## 2012-05-04 ENCOUNTER — Ambulatory Visit (HOSPITAL_BASED_OUTPATIENT_CLINIC_OR_DEPARTMENT_OTHER)
Admission: RE | Admit: 2012-05-04 | Discharge: 2012-05-04 | Disposition: A | Discharge status: Home / Self Care | Payer: Medicare (Managed Care) | Source: Ambulatory Visit | Attending: General Surgery | Admitting: General Surgery

## 2012-05-04 ENCOUNTER — Ambulatory Visit (HOSPITAL_BASED_OUTPATIENT_CLINIC_OR_DEPARTMENT_OTHER): Payer: Medicare (Managed Care) | Admitting: Anesthesiology

## 2012-05-04 ENCOUNTER — Encounter (HOSPITAL_BASED_OUTPATIENT_CLINIC_OR_DEPARTMENT_OTHER)
Admission: RE | Disposition: A | Discharge status: Home / Self Care | Payer: Self-pay | Source: Ambulatory Visit | Attending: General Surgery

## 2012-05-04 DIAGNOSIS — Z170 Estrogen receptor positive status [ER+]: Secondary | ICD-10-CM | POA: Insufficient documentation

## 2012-05-04 DIAGNOSIS — C50419 Malignant neoplasm of upper-outer quadrant of unspecified female breast: Secondary | ICD-10-CM

## 2012-05-04 DIAGNOSIS — Z01812 Encounter for preprocedural laboratory examination: Secondary | ICD-10-CM | POA: Insufficient documentation

## 2012-05-04 DIAGNOSIS — I10 Essential (primary) hypertension: Secondary | ICD-10-CM | POA: Insufficient documentation

## 2012-05-04 DIAGNOSIS — D0590 Unspecified type of carcinoma in situ of unspecified breast: Secondary | ICD-10-CM | POA: Insufficient documentation

## 2012-05-04 DIAGNOSIS — Z01810 Encounter for preprocedural cardiovascular examination: Secondary | ICD-10-CM | POA: Insufficient documentation

## 2012-05-04 DIAGNOSIS — C50919 Malignant neoplasm of unspecified site of unspecified female breast: Secondary | ICD-10-CM

## 2012-05-04 DIAGNOSIS — Z8673 Personal history of transient ischemic attack (TIA), and cerebral infarction without residual deficits: Secondary | ICD-10-CM | POA: Insufficient documentation

## 2012-05-04 DIAGNOSIS — K219 Gastro-esophageal reflux disease without esophagitis: Secondary | ICD-10-CM | POA: Insufficient documentation

## 2012-05-04 HISTORY — DX: Glossodynia: K14.6

## 2012-05-04 HISTORY — PX: BREAST LUMPECTOMY: SHX2

## 2012-05-04 HISTORY — DX: Malignant neoplasm of unspecified site of unspecified female breast: C50.919

## 2012-05-04 SURGERY — BREAST LUMPECTOMY WITH NEEDLE LOCALIZATION AND AXILLARY SENTINEL LYMPH NODE BX
Anesthesia: General | Site: Breast | Laterality: Right | Wound class: Clean

## 2012-05-04 MED ORDER — ONDANSETRON HCL 4 MG/2ML IJ SOLN: 4.0000 mg | Freq: Four times a day (QID) | INTRAMUSCULAR | Status: DC | PRN

## 2012-05-04 MED ORDER — SODIUM CHLORIDE 0.9 % IV SOLN: INTRAVENOUS | Status: DC

## 2012-05-04 MED ORDER — ONDANSETRON HCL 4 MG/2ML IJ SOLN
INTRAMUSCULAR | Status: DC | PRN
  Administered 2012-05-04: 4 mg via INTRAVENOUS

## 2012-05-04 MED ORDER — HEPARIN SODIUM (PORCINE) 5000 UNIT/ML IJ SOLN
5000.0000 [IU] | Freq: Once | INTRAMUSCULAR | Status: AC
  Administered 2012-05-04: 5000 [IU] via SUBCUTANEOUS

## 2012-05-04 MED ORDER — CHLORHEXIDINE GLUCONATE 4 % EX LIQD: 1.0000 "application " | Freq: Once | CUTANEOUS | Status: DC

## 2012-05-04 MED ORDER — 0.9 % SODIUM CHLORIDE (POUR BTL) OPTIME
TOPICAL | Status: DC | PRN
  Administered 2012-05-04: 1000 mL

## 2012-05-04 MED ORDER — LACTATED RINGERS IV SOLN
INTRAVENOUS | Status: DC
  Administered 2012-05-04 (×2): via INTRAVENOUS

## 2012-05-04 MED ORDER — BUPIVACAINE-EPINEPHRINE 0.5% -1:200000 IJ SOLN
INTRAMUSCULAR | Status: DC | PRN
  Administered 2012-05-04: 17 mL

## 2012-05-04 MED ORDER — OXYCODONE HCL 5 MG PO TABS
5.0000 mg | ORAL_TABLET | Freq: Once | ORAL | Status: AC | PRN
  Administered 2012-05-04: 5 mg via ORAL

## 2012-05-04 MED ORDER — TECHNETIUM TC 99M SULFUR COLLOID FILTERED
1.0000 | Freq: Once | INTRAVENOUS | Status: AC | PRN
  Administered 2012-05-04: 1 via INTRADERMAL

## 2012-05-04 MED ORDER — FENTANYL CITRATE 0.05 MG/ML IJ SOLN
50.0000 ug | INTRAMUSCULAR | Status: DC | PRN
  Administered 2012-05-04: 100 ug via INTRAVENOUS

## 2012-05-04 MED ORDER — SODIUM CHLORIDE 0.9 % IJ SOLN: 3.0000 mL | INTRAMUSCULAR | Status: DC | PRN

## 2012-05-04 MED ORDER — DEXAMETHASONE SODIUM PHOSPHATE 4 MG/ML IJ SOLN
INTRAMUSCULAR | Status: DC | PRN
  Administered 2012-05-04: 4 mg via INTRAVENOUS

## 2012-05-04 MED ORDER — LIDOCAINE HCL (CARDIAC) 20 MG/ML IV SOLN
INTRAVENOUS | Status: DC | PRN
  Administered 2012-05-04: 40 mg via INTRAVENOUS

## 2012-05-04 MED ORDER — MIDAZOLAM HCL 2 MG/2ML IJ SOLN
0.5000 mg | INTRAMUSCULAR | Status: DC | PRN
  Administered 2012-05-04: 2 mg via INTRAVENOUS
  Administered 2012-05-04: 1 mg via INTRAVENOUS

## 2012-05-04 MED ORDER — HYDROMORPHONE HCL PF 1 MG/ML IJ SOLN
0.2500 mg | INTRAMUSCULAR | Status: DC | PRN
  Administered 2012-05-04: 0.25 mg via INTRAVENOUS
  Administered 2012-05-04 (×2): 0.5 mg via INTRAVENOUS
  Administered 2012-05-04: 0.25 mg via INTRAVENOUS

## 2012-05-04 MED ORDER — HYDROCODONE-ACETAMINOPHEN 5-325 MG PO TABS: 1.0000 | ORAL_TABLET | ORAL | Status: AC | PRN

## 2012-05-04 MED ORDER — EPHEDRINE SULFATE 50 MG/ML IJ SOLN
INTRAMUSCULAR | Status: DC | PRN
  Administered 2012-05-04 (×3): 10 mg via INTRAVENOUS

## 2012-05-04 MED ORDER — CEFAZOLIN SODIUM 1-5 GM-% IV SOLN
1.0000 g | INTRAVENOUS | Status: AC
  Administered 2012-05-04: 1 g via INTRAVENOUS

## 2012-05-04 MED ORDER — METOCLOPRAMIDE HCL 5 MG/ML IJ SOLN: 10.0000 mg | Freq: Once | INTRAMUSCULAR | Status: DC | PRN

## 2012-05-04 MED ORDER — OXYCODONE HCL 5 MG PO TABS: 5.0000 mg | ORAL_TABLET | ORAL | Status: DC | PRN

## 2012-05-04 MED ORDER — ACETAMINOPHEN 325 MG PO TABS: 650.0000 mg | ORAL_TABLET | ORAL | Status: DC | PRN

## 2012-05-04 MED ORDER — SODIUM CHLORIDE 0.9 % IV SOLN: 250.0000 mL | INTRAVENOUS | Status: DC | PRN

## 2012-05-04 MED ORDER — SODIUM CHLORIDE 0.9 % IJ SOLN
INTRAMUSCULAR | Status: DC | PRN
  Administered 2012-05-04: 13:00:00 via INTRADERMAL

## 2012-05-04 MED ORDER — ACETAMINOPHEN 10 MG/ML IV SOLN
1000.0000 mg | Freq: Once | INTRAVENOUS | Status: AC
  Administered 2012-05-04: 1000 mg via INTRAVENOUS

## 2012-05-04 MED ORDER — MORPHINE SULFATE 2 MG/ML IJ SOLN: 2.0000 mg | INTRAMUSCULAR | Status: DC | PRN

## 2012-05-04 MED ORDER — ACETAMINOPHEN 650 MG RE SUPP: 650.0000 mg | RECTAL | Status: DC | PRN

## 2012-05-04 MED ORDER — FENTANYL CITRATE 0.05 MG/ML IJ SOLN
INTRAMUSCULAR | Status: DC | PRN
Start: 2012-05-04 — End: 2012-05-04
  Administered 2012-05-04: 50 ug via INTRAVENOUS

## 2012-05-04 MED ORDER — SODIUM CHLORIDE 0.9 % IJ SOLN: 3.0000 mL | Freq: Two times a day (BID) | INTRAMUSCULAR | Status: DC

## 2012-05-04 MED ORDER — DROPERIDOL 2.5 MG/ML IJ SOLN
INTRAMUSCULAR | Status: DC | PRN
  Administered 2012-05-04: 0.625 mg via INTRAVENOUS

## 2012-05-04 MED ORDER — PROPOFOL 10 MG/ML IV EMUL
INTRAVENOUS | Status: DC | PRN
  Administered 2012-05-04: 250 mg via INTRAVENOUS

## 2012-05-04 MED ORDER — MIDAZOLAM HCL 5 MG/5ML IJ SOLN
INTRAMUSCULAR | Status: DC | PRN
  Administered 2012-05-04: 1 mg via INTRAVENOUS

## 2012-05-04 SURGICAL SUPPLY — 61 items
APPLIER CLIP 11 MED OPEN (CLIP) ×2
BANDAGE ELASTIC 6 VELCRO ST LF (GAUZE/BANDAGES/DRESSINGS) IMPLANT
BENZOIN TINCTURE PRP APPL 2/3 (GAUZE/BANDAGES/DRESSINGS) IMPLANT
BINDER BREAST LRG (GAUZE/BANDAGES/DRESSINGS) ×2 IMPLANT
BLADE HEX COATED 2.75 (ELECTRODE) ×2 IMPLANT
BLADE SURG 10 STRL SS (BLADE) IMPLANT
BLADE SURG 15 STRL LF DISP TIS (BLADE) ×1 IMPLANT
BLADE SURG 15 STRL SS (BLADE) ×1
CANISTER SUCTION 1200CC (MISCELLANEOUS) ×2 IMPLANT
CHLORAPREP W/TINT 26ML (MISCELLANEOUS) ×2 IMPLANT
CLIP APPLIE 11 MED OPEN (CLIP) ×1 IMPLANT
CLOTH BEACON ORANGE TIMEOUT ST (SAFETY) ×2 IMPLANT
COVER MAYO STAND STRL (DRAPES) ×2 IMPLANT
COVER PROBE W GEL 5X96 (DRAPES) ×2 IMPLANT
COVER TABLE BACK 60X90 (DRAPES) ×2 IMPLANT
DECANTER SPIKE VIAL GLASS SM (MISCELLANEOUS) IMPLANT
DERMABOND ADVANCED (GAUZE/BANDAGES/DRESSINGS) ×1
DERMABOND ADVANCED .7 DNX12 (GAUZE/BANDAGES/DRESSINGS) ×1 IMPLANT
DEVICE DUBIN W/COMP PLATE 8390 (MISCELLANEOUS) ×2 IMPLANT
DRAIN CHANNEL 19F RND (DRAIN) IMPLANT
DRAIN HEMOVAC 1/8 X 5 (WOUND CARE) IMPLANT
DRAPE LAPAROSCOPIC ABDOMINAL (DRAPES) ×2 IMPLANT
DRAPE UTILITY XL STRL (DRAPES) ×2 IMPLANT
DRSG PAD ABDOMINAL 8X10 ST (GAUZE/BANDAGES/DRESSINGS) IMPLANT
ELECT REM PT RETURN 9FT ADLT (ELECTROSURGICAL) ×2
ELECTRODE REM PT RTRN 9FT ADLT (ELECTROSURGICAL) ×1 IMPLANT
EVACUATOR SILICONE 100CC (DRAIN) IMPLANT
GAUZE SPONGE 4X4 12PLY STRL LF (GAUZE/BANDAGES/DRESSINGS) IMPLANT
GAUZE SPONGE 4X4 16PLY XRAY LF (GAUZE/BANDAGES/DRESSINGS) IMPLANT
GLOVE ECLIPSE 6.5 STRL STRAW (GLOVE) ×2 IMPLANT
GLOVE EUDERMIC 7 POWDERFREE (GLOVE) ×2 IMPLANT
GOWN PREVENTION PLUS XLARGE (GOWN DISPOSABLE) IMPLANT
GOWN PREVENTION PLUS XXLARGE (GOWN DISPOSABLE) ×4 IMPLANT
KIT MARKER MARGIN INK (KITS) ×2 IMPLANT
NDL SAFETY ECLIPSE 18X1.5 (NEEDLE) ×1 IMPLANT
NEEDLE HYPO 18GX1.5 SHARP (NEEDLE) ×1
NEEDLE HYPO 25X1 1.5 SAFETY (NEEDLE) ×4 IMPLANT
NS IRRIG 1000ML POUR BTL (IV SOLUTION) ×2 IMPLANT
PACK BASIN DAY SURGERY FS (CUSTOM PROCEDURE TRAY) ×2 IMPLANT
PAD ALCOHOL SWAB (MISCELLANEOUS) ×2 IMPLANT
PENCIL BUTTON HOLSTER BLD 10FT (ELECTRODE) ×2 IMPLANT
PIN SAFETY STERILE (MISCELLANEOUS) IMPLANT
SLEEVE SCD COMPRESS KNEE MED (MISCELLANEOUS) ×2 IMPLANT
SPONGE GAUZE 4X4 12PLY (GAUZE/BANDAGES/DRESSINGS) ×2 IMPLANT
SPONGE LAP 18X18 X RAY DECT (DISPOSABLE) IMPLANT
SPONGE LAP 4X18 X RAY DECT (DISPOSABLE) ×4 IMPLANT
STRIP CLOSURE SKIN 1/2X4 (GAUZE/BANDAGES/DRESSINGS) IMPLANT
SUT ETHILON 3 0 FSL (SUTURE) IMPLANT
SUT MNCRL AB 4-0 PS2 18 (SUTURE) ×4 IMPLANT
SUT SILK 2 0 SH (SUTURE) ×2 IMPLANT
SUT VIC AB 2-0 CT1 27 (SUTURE)
SUT VIC AB 2-0 CT1 TAPERPNT 27 (SUTURE) IMPLANT
SUT VIC AB 3-0 SH 27 (SUTURE)
SUT VIC AB 3-0 SH 27X BRD (SUTURE) IMPLANT
SUT VICRYL 3-0 CR8 SH (SUTURE) ×2 IMPLANT
SYR CONTROL 10ML LL (SYRINGE) ×4 IMPLANT
TOWEL OR 17X24 6PK STRL BLUE (TOWEL DISPOSABLE) ×4 IMPLANT
TOWEL OR NON WOVEN STRL DISP B (DISPOSABLE) IMPLANT
TUBE CONNECTING 20X1/4 (TUBING) ×2 IMPLANT
WATER STERILE IRR 1000ML POUR (IV SOLUTION) IMPLANT
YANKAUER SUCT BULB TIP NO VENT (SUCTIONS) ×2 IMPLANT

## 2012-05-04 NOTE — Transfer of Care (Signed)
Immediate Anesthesia Transfer of Care Note  Patient: Angela Boyer  Procedure(s) Performed: Procedure(s) (LRB): BREAST LUMPECTOMY WITH NEEDLE LOCALIZATION AND AXILLARY SENTINEL LYMPH NODE BX (Right)  Patient Location: PACU  Anesthesia Type: General  Level of Consciousness: sedated  Airway & Oxygen Therapy: Patient Spontanous Breathing and Patient connected to face mask oxygen  Post-op Assessment: Report given to PACU RN and Post -op Vital signs reviewed and stable  Post vital signs: Reviewed and stable  Complications: No apparent anesthesia complications

## 2012-05-04 NOTE — Anesthesia Postprocedure Evaluation (Signed)
Anesthesia Post Note  Patient: Angela Boyer  Procedure(s) Performed: Procedure(s) (LRB): BREAST LUMPECTOMY WITH NEEDLE LOCALIZATION AND AXILLARY SENTINEL LYMPH NODE BX (Right)  Anesthesia type: General  Patient location: PACU  Post pain: Pain level controlled  Post assessment: Patient's Cardiovascular Status Stable  Last Vitals:  Filed Vitals:   05/04/12 1615  BP: 97/60  Pulse:   Temp:   Resp:     Post vital signs: Reviewed and stable  Level of consciousness: alert  Complications: No apparent anesthesia complications

## 2012-05-04 NOTE — Op Note (Signed)
Patient Name:           Angela Boyer   Date of Surgery:        05/04/2012  Pre op Diagnosis:      Ductal carcinoma in situ right breast, upper outer quadrant, receptor positive  Post op Diagnosis:    Same, clinical stage Tis, N0  Procedure:                 Inject blue dye right breast, right partial mastectomy with needle localization, reexcision anterior/superior margin, right axillary sentinel lymph node biopsy  Surgeon:                     [name]. Derrell Lolling, M.D., FACS  Assistant:                      none  Operative Indications:   This is a 51 year old Caucasian female who recently had screening mammograms that showed a focal area of microcalcifications in the upper outer quadrant of the right breast at the 11:00 position. The left breast looked normal. Image guided biopsy of these calcifications showed high-grade DCIS, ER and PR strongly positive. MRI showed only biopsy changes in the right breast, otherwise negative. Family history is positive for breast cancer in her mother. She was interested in breast conservation surgery. She is brought to the operating room for right partial mastectomy and sentinel node biopsy.  Operative Findings:       The right partial mastectomy specimen looked a little bit close on the anterior and superior margin so I reexcised the superior and anterior margins as a separate specimen. I found 2 sentinel lymph nodes.  Procedure in Detail:          The patient underwent wire localization at the breast center of [facility] and then came to [facility]. The right breast was injected with radionuclide by the nuclear medicine technician. The patient was taken to the operating room and general anesthesia was induced. Intravenous antibiotics were given. Surgical time out was performed. Following alcohol prep I injected 5 cc of blue dye in the right subareolar area. This was 2 cc of methylene blue mixed with 3 cc of saline. I massaged the breast for 5  minutes. The right breast and axilla were then prepped and draped in sterile fashion. 0.5% Marcaine with epinephrine was used as a local infiltration anesthetic. A curved circumareolar incision was made in the right breast superiolaterally going through the insertion site of the wire which was down at about the 9:30 position. Dissection was carried down and around the wire widely. I did not see or feel any malignant change. The specimen was removed and marked with the ink marking  kit. Specimen mammogram looked good but I thought the margins superiorly might be a little bit close. I reexcised the superior and anterior margins and remarked those with the ink  kit and sent this to the lab as a separate specimen.  Hemostasis was excellent and achieved with electrocautery. The wound was irrigated with saline. I  I marked the lumpectomy cavity with metal clips. I closed the lumpectomy cavity with interrupted sutures of 3 Vicryl, closing this in 2 layers. Then closed the skin with a running subcuticular suture of 4-0 Monocryl and Dermabond.  I then brought the neoprobe to the operative field and  used that to map the right axilla. A transverse incision was made in the right axilla at the hairline. Dissection  was carried down through subcutaneous tissue and incising the clavipectoral fascia I entered the axillary space. Using the neoprobe I found 2 very hot, very blue lymph nodes. These were removed and sent for routine histology. I found no other radioactivity after this. Hemostasis was excellent and achieved with electrocautery. The wound was irrigated with saline. The subcutaneous tissue was closed with interrupted sutures of 3-0 Vicryl and the skin closed with a running subcuticular suture of 4-0 Monocryl and Dermabond. Breast binder was placed and the patient taken recovery room in stable condition. EBL 20 cc. Complications none. Counts correct.     [name]. Derrell Lolling, M.D., FACS General and Minimally Invasive  Surgery Breast and Colorectal Surgery  05/04/2012 2:39 PM

## 2012-05-04 NOTE — Anesthesia Procedure Notes (Signed)
Procedure Name: LMA Insertion Date/Time: 05/04/2012 1:22 PM Performed by: Verlan Friends Pre-anesthesia Checklist: Patient identified, Emergency Drugs available, Suction available, Patient being monitored and Timeout performed Patient Re-evaluated:Patient Re-evaluated prior to inductionOxygen Delivery Method: Circle System Utilized Preoxygenation: Pre-oxygenation with 100% oxygen Intubation Type: IV induction Ventilation: Mask ventilation without difficulty LMA: LMA inserted LMA Size: 4.0 Number of attempts: 1 (atraumatic) Airway Equipment and Method: bite block (Right posterior bite gard  used) Placement Confirmation: positive ETCO2 Tube secured with: Tape (pink tape used) Dental Injury: Teeth and Oropharynx as per pre-operative assessment  Comments: Patient has veneers on max. Anterior teeth, these were untouched when LMA was inserted.

## 2012-05-04 NOTE — Interval H&P Note (Signed)
History and Physical Interval Note:  05/04/2012 1:07 PM  Angela Boyer  has presented today for surgery, with the diagnosis of RIGHT DUCTAL CARCINOMA IN SITU   The goals of treatment and the various methods of treatment have been discussed with the patient and family. After consideration of risks, benefits and other options for treatment, the patient has consented to  Procedure(s) (LRB): BREAST LUMPECTOMY WITH NEEDLE LOCALIZATION AND AXILLARY SENTINEL LYMPH NODE BX (Right) as a surgical intervention .  The patients' history has been reviewed, patient examined, no change in status, stable for surgery.  I have reviewed the patients' chart and labs.  Questions were answered to the patient's satisfaction.     Ernestene Mention

## 2012-05-04 NOTE — Discharge Instructions (Signed)
Expect a call  this Friday to discuss the final pathology report with you.  Make an appointment to see Dr. Derrell Lolling in about 3 weeks.  You may shower, starting tomorrow. Do not take a tub bath or get in a swimming pool.  Percocet one or two tabs by mouth for pain every 4 hours as needed.   [facility] Office Phone Number [phone]  BREAST BIOPSY/ PARTIAL MASTECTOMY: POST OP INSTRUCTIONS  Always review your discharge instruction sheet given to you by the facility where your surgery was performed.  IF YOU HAVE DISABILITY OR FAMILY LEAVE FORMS, YOU MUST BRING THEM TO THE OFFICE FOR PROCESSING.  DO NOT GIVE THEM TO YOUR DOCTOR.  1. A prescription for pain medication may be given to you upon discharge.  Take your pain medication as prescribed, if needed.  If narcotic pain medicine is not needed, then you may take acetaminophen (Tylenol) or ibuprofen (Advil) as needed. 2. Take your usually prescribed medications unless otherwise directed 3. If you need a refill on your pain medication, please contact your pharmacy.  They will contact our office to request authorization.  Prescriptions will not be filled after 5pm or on week-ends. 4. You should eat very light the first 24 hours after surgery, such as soup, crackers, pudding, etc.  Resume your normal diet the day after surgery. 5. Most patients will experience some swelling and bruising in the breast.  Ice packs and a good support bra will help.  Swelling and bruising can take several days to resolve.  6. It is common to experience some constipation if taking pain medication after surgery.  Increasing fluid intake and taking a stool softener will usually help or prevent this problem from occurring.  A mild laxative (Milk of Magnesia or Miralax) should be taken according to package directions if there are no bowel movements after 48 hours. 7. Unless discharge instructions indicate otherwise, you may remove your bandages 24-48 hours  after surgery, and you may shower at that time.  You may have steri-strips (small skin tapes) in place directly over the incision.  These strips should be left on the skin for 7-10 days.  If your surgeon used skin glue on the incision, you may shower in 24 hours.  The glue will flake off over the next 2-3 weeks.  Any sutures or staples will be removed at the office during your follow-up visit. 8. ACTIVITIES:  You may resume regular daily activities (gradually increasing) beginning the next day.  Wearing a good support bra or sports bra minimizes pain and swelling.  You may have sexual intercourse when it is comfortable. a. You may drive when you no longer are taking prescription pain medication, you can comfortably wear a seatbelt, and you can safely maneuver your car and apply brakes. b. RETURN TO WORK:  ______________________________________________________________________________________ 9. You should see your doctor in the office for a follow-up appointment approximately two weeks after your surgery.  Your doctor's nurse will typically make your follow-up appointment when she calls you with your pathology report.  Expect your pathology report 2-3 business days after your surgery.  You may call to check if you do not hear from [place] after three days. 10. OTHER INSTRUCTIONS: _______________________________________________________________________________________________ _____________________________________________________________________________________________________________________________________ _____________________________________________________________________________________________________________________________________ _____________________________________________________________________________________________________________________________________  WHEN TO CALL YOUR DOCTOR: 1. Fever over 101.0 2. Nausea and/or vomiting. 3. Extreme swelling or bruising. 4. Continued bleeding from  incision. 5. Increased pain, redness, or drainage from the incision.  The clinic staff is available to answer your  questions during regular business hours.  Please don't hesitate to call and ask to speak to one of the nurses for clinical concerns.  If you have a medical emergency, go to the nearest emergency room or call 911.  A surgeon from [facility] is always on call at the hospital.  For further questions, please visit [email or web address]      Post Anesthesia Home Care Instructions  Activity: Get plenty of rest for the remainder of the day. A responsible adult should stay with you for 24 hours following the procedure.  For the next 24 hours, DO NOT: -Drive a car -Advertising copywriter -Drink alcoholic beverages -Take any medication unless instructed by your physician -Make any legal decisions or sign important papers.  Meals: Start with liquid foods such as gelatin or soup. Progress to regular foods as tolerated. Avoid greasy, spicy, heavy foods. If nausea and/or vomiting occur, drink only clear liquids until the nausea and/or vomiting subsides. Call your physician if vomiting continues.  Special Instructions/Symptoms: Your throat may feel dry or sore from the anesthesia or the breathing tube placed in your throat during surgery. If this causes discomfort, gargle with warm salt water. The discomfort should disappear within 24 hours.

## 2012-05-04 NOTE — Anesthesia Preprocedure Evaluation (Signed)
Anesthesia Evaluation  Patient identified by MRN, date of birth, ID band Patient awake    Reviewed: Allergy & Precautions, H&P , NPO status , Patient's Chart, lab work & pertinent test results, reviewed documented beta blocker date and time   Airway Mallampati: II TM Distance: >3 FB Neck ROM: full    Dental   Pulmonary neg pulmonary ROS,          Cardiovascular hypertension, On Medications     Neuro/Psych PSYCHIATRIC DISORDERS CVA, No Residual Symptoms negative neurological ROS     GI/Hepatic negative GI ROS, Neg liver ROS, GERD-  Medicated and Controlled,  Endo/Other  negative endocrine ROS  Renal/GU negative Renal ROS  negative genitourinary   Musculoskeletal   Abdominal   Peds  Hematology negative hematology ROS (+)   Anesthesia Other Findings See surgeon's H&P   Reproductive/Obstetrics negative OB ROS                           Anesthesia Physical Anesthesia Plan  ASA: III  Anesthesia Plan: General   Post-op Pain Management:    Induction: Intravenous  Airway Management Planned: LMA  Additional Equipment:   Intra-op Plan:   Post-operative Plan: Extubation in OR  Informed Consent: I have reviewed the patients History and Physical, chart, labs and discussed the procedure including the risks, benefits and alternatives for the proposed anesthesia with the patient or authorized representative who has indicated his/her understanding and acceptance.   Dental Advisory Given  Plan Discussed with: CRNA and Surgeon  Anesthesia Plan Comments:         Anesthesia Quick Evaluation

## 2012-05-05 ENCOUNTER — Telehealth (INDEPENDENT_AMBULATORY_CARE_PROVIDER_SITE_OTHER): Payer: Self-pay

## 2012-05-05 NOTE — Telephone Encounter (Signed)
Per Riki Rusk at [facility] Pt received Oxycodone 10/500 # 90 from Dr Leonette Most on 04-17-12 as a 30 day supply. Cannot fill rx for oxycodone 5/325 written by Dr Derrell Lolling until 05-18-12 without auth. From Dr Derrell Lolling. I advised Riki Rusk I will call pt and let her know. I called pt and advised her that [facility] will hold the oxycodone 5/325 until 05-18-12 and if she has need of pain med at this time she can pick up this rx at that time. Pt advised she cannot take both meds at the same time. Pt advised if she already takes the 10/500 daily this should cover any pain from her breast biopsy. Pt states she is also taking advil to suppliment. Pt to call with any other concerns.

## 2012-05-06 ENCOUNTER — Telehealth (INDEPENDENT_AMBULATORY_CARE_PROVIDER_SITE_OTHER): Payer: Self-pay

## 2012-05-06 NOTE — Telephone Encounter (Signed)
Pt advised of path result per Dr Jacinto Halim request. PO appt made.

## 2012-05-06 NOTE — Telephone Encounter (Signed)
Pt calling for her path results. Pls call [phone]

## 2012-05-16 ENCOUNTER — Telehealth (INDEPENDENT_AMBULATORY_CARE_PROVIDER_SITE_OTHER): Payer: Self-pay | Admitting: General Surgery

## 2012-05-16 NOTE — Telephone Encounter (Signed)
Called back patient to advised that upon consultation with Dr. Derrell Lolling she can use antibacterial soap or use baking soda as an alternative until she completes her radiation treatment, no deodorant use until she it has been completed. Patient confirmed appointment with radiation oncology on 05/18/12 and appointment with Dr. Derrell Lolling on 05/19/12.

## 2012-05-17 ENCOUNTER — Encounter: Payer: Self-pay | Admitting: *Deleted

## 2012-05-18 ENCOUNTER — Ambulatory Visit
Admission: RE | Admit: 2012-05-18 | Discharge: 2012-05-18 | Disposition: A | Discharge status: Home / Self Care | Payer: Medicare (Managed Care) | Source: Ambulatory Visit | Attending: Radiation Oncology | Admitting: Radiation Oncology

## 2012-05-18 ENCOUNTER — Encounter: Payer: Self-pay | Admitting: *Deleted

## 2012-05-18 ENCOUNTER — Encounter: Payer: Self-pay | Admitting: Radiation Oncology

## 2012-05-18 VITALS — BP 124/85 | HR 76 | Temp 98.1°F | Resp 20 | Wt 160.8 lb

## 2012-05-18 DIAGNOSIS — D0590 Unspecified type of carcinoma in situ of unspecified breast: Secondary | ICD-10-CM | POA: Insufficient documentation

## 2012-05-18 DIAGNOSIS — C50419 Malignant neoplasm of upper-outer quadrant of unspecified female breast: Secondary | ICD-10-CM

## 2012-05-18 DIAGNOSIS — C50919 Malignant neoplasm of unspecified site of unspecified female breast: Secondary | ICD-10-CM

## 2012-05-18 DIAGNOSIS — Z170 Estrogen receptor positive status [ER+]: Secondary | ICD-10-CM | POA: Insufficient documentation

## 2012-05-18 DIAGNOSIS — Z79899 Other long term (current) drug therapy: Secondary | ICD-10-CM | POA: Insufficient documentation

## 2012-05-18 NOTE — Progress Notes (Signed)
Radiation Oncology         [phone] ________________________________  Name: Angela Boyer MRN: [id number]  Date: 05/18/2012  DOB: [birth year]  Follow-Up Visit Note  CC: Alice Reichert, MD  Ernestene Mention, MD  Diagnosis:   DCIS of the right breast, ER positive PR positive  Interval Since Last Radiation:  Not applicable   Narrative:  The patient returns today for routine follow-up.  The patient was initially seen in multidisciplinary breast clinic. Since that time she has proceeded with a right-sided lumpectomy on 05/04/2012. Her specimen returned positive for DCIS with comedo necrosis and calcifications. The margins were negative with the closest margin being 0.5 cm. The sentinel lymph node biopsy looked at 2 lymph nodes, both of which were negative. The tumor is ER positive and PR positive. The patient indicates that she has done well since she finished her treatment. Her soreness has been improving. She does see Dr. Derrell Lolling tomorrow. She is not aware of any appointment with Dr. Darnelle Catalan who she also saw previously in clinic.                              ALLERGIES:  is allergic to tape and vicodin.  Meds: Current Outpatient Prescriptions  Medication Sig Dispense Refill  . amLODipine (NORVASC) 10 MG tablet Take 10 mg by mouth daily.        . Aspirin-Acetaminophen (GOODY BODY PAIN) 500-325 MG PACK Take 1 Container by mouth as needed. 1 goody powder severe headaches, prn      . benazepril (LOTENSIN) 20 MG tablet Take 20 mg by mouth daily.        . clonazePAM (KLONOPIN) 0.5 MG tablet Take 0.5 mg by mouth 2 (two) times daily. Only takes in afternoon, doesn't take in mornings"knocks me out " stated patient 05/18/12      . gabapentin (NEURONTIN) 600 MG tablet Take 600 mg by mouth 2 (two) times daily.        Marland Kitchen ibuprofen (ADVIL,MOTRIN) 200 MG tablet Take 400 mg by mouth every 8 (eight) hours as needed.      Marland Kitchen LORazepam (ATIVAN) 0.5 MG tablet Take 0.5 mg by mouth every 8 (eight) hours as  needed. Takes prn when panic attacks occur stated patient 05/18/12      . omeprazole (PRILOSEC) 40 MG capsule Take 40 mg by mouth daily.        Marland Kitchen oxyCODONE (OXYCONTIN) 10 MG 12 hr tablet Take 10 mg by mouth every 12 (twelve) hours. Takes 5mg  at one time up to 30mg  daily for burning tongue syndrome per patient stated 05/18/12      . zolpidem (AMBIEN) 10 MG tablet Take 10 mg by mouth at bedtime.        Marland Kitchen oxyCODONE-acetaminophen (PERCOCET) 10-325 MG per tablet Take 1 tablet by mouth every 4 (four) hours as needed.        Physical Findings: The patient is in no acute distress. Patient is alert and oriented.  weight is 160 lb 12.8 oz (72.938 kg). Her oral temperature is 98.1 F (36.7 C). Her blood pressure is 124/85 and her pulse is 76. Her respiration is 20. .   Breasts: She is status post lumpectomy and sentinel lymph node biopsy with 2 well healing surgical incisions present, within the upper outer quadrant of the right breast and the right axilla. No sign of infection.  Lab Findings: Lab Results  Component Value Date   WBC  8.9 05/02/2012   HGB 14.0 05/02/2012   HCT 41.6 05/02/2012   MCV 93.5 05/02/2012   PLT 218 05/02/2012     Radiographic Findings: Chest 2 View  05/02/2012  *RADIOLOGY REPORT*  Clinical Data: Preoperative respiratory evaluation for breast surgery.  CHEST - 2 VIEW  Comparison: None.  Findings: The lungs are clear without focal infiltrate, edema, pneumothorax or pleural effusion. The cardiopericardial silhouette is within normal limits for size. Imaged bony structures of the thorax are intact.  IMPRESSION: No acute cardiopulmonary findings.  Original Report Authenticated By: ERIC A. MANSELL, M.D.   Nm Sentinel Node Inj-no Rpt (breast)  05/04/2012  CLINICAL DATA: cancer right breast   Sulfur colloid was injected intradermally by the nuclear medicine  technologist for breast cancer sentinel node localization.     Mm Breast Surgical Specimen  05/04/2012  *RADIOLOGY REPORT*   Clinical Data:  Ductal carcinoma in situ in the right upper outer quadrant  RIGHT BREAST NEEDLE LOCALIZATION WITH MAMMOGRAPHIC GUIDANCE AND SPECIMEN RADIOGRAPH  Patient presents for needle localization prior to surgical excision. The patient and I discussed the procedure of needle localization including benefits and alternatives. We discussed the high likelihood of a successful procedure. We discussed the risks of the procedure, including infection, bleeding, tissue injury, and further surgery. Informed written consent was given.  Using mammographic guidance, sterile technique, 2% lidocaine and a 5 cm modified Kopans needle, the clip marking the biopsy site in the right upper outer quadrant anteriorly was localized using a lateromedial approach. Films were labeled and sent with the patient surgery.  She tolerated the procedure well.  Specimen radiograph was performed at [facility] and confirms the clip and wire to be present in the tissue sample. The specimen is marked for pathology.  IMPRESSION: Needle localization right breast.  No apparent complications.  Original Report Authenticated By: Daryl Eastern, M.D.   Mm Breast Wire Localization Right  05/04/2012  *RADIOLOGY REPORT*  Clinical Data:  Ductal carcinoma in situ in the right upper outer quadrant  RIGHT BREAST NEEDLE LOCALIZATION WITH MAMMOGRAPHIC GUIDANCE AND SPECIMEN RADIOGRAPH  Patient presents for needle localization prior to surgical excision. The patient and I discussed the procedure of needle localization including benefits and alternatives. We discussed the high likelihood of a successful procedure. We discussed the risks of the procedure, including infection, bleeding, tissue injury, and further surgery. Informed written consent was given.  Using mammographic guidance, sterile technique, 2% lidocaine and a 5 cm modified Kopans needle, the clip marking the biopsy site in the right upper outer quadrant anteriorly was localized  using a lateromedial approach. Films were labeled and sent with the patient surgery.  She tolerated the procedure well.  Specimen radiograph was performed at [facility] and confirms the clip and wire to be present in the tissue sample. The specimen is marked for pathology.  IMPRESSION: Needle localization right breast.  No apparent complications.  Original Report Authenticated By: Daryl Eastern, M.D.    Impression:    Pleasant 51 year old female who is status post lumpectomy for DCIS of the right breast.   Plan:  The patient is an appropriate candidate to proceed with a course of adjuvant radiation. She is interested in treatment and evening if possible. I do believe that this would be reasonable and we therefore discussed the patient proceeding with a 6-1/2 week course of treatment at this facility. She is now just a couple of weeks out from her surgery. I will  need to coordinate with [facility] when we can proceed with a simulation. We did discuss the possible side effects and risks of treatment and all of her questions were answered. She does understand the rationale of adjuvant radiation in terms of improving local control. I therefore look forward to proceeding with her treatment planning in the near future.   I spent 30 minutes with the patient today, the majority of which was spent counseling the patient on the diagnosis of cancer and coordinating care.   Radene Gunning, M.D., Ph.D.

## 2012-05-18 NOTE — Progress Notes (Signed)
Please see the Nurse Progress Note in the MD Initial Consult Encounter for this patient. 

## 2012-05-18 NOTE — Progress Notes (Signed)
Path:05/04/12: Right breast lumpectomy=Ductal Ca in Situ,ER/PR=positive upper outer quadrant  Patient alert,oriented x 3,  3 children, mother with breast ca dx age 52, , and Melanoma in father   Patient may want to be treated in [place]        Allergies:vicodin-nausea/vomiting,tape=thin skin

## 2012-05-19 ENCOUNTER — Encounter (INDEPENDENT_AMBULATORY_CARE_PROVIDER_SITE_OTHER): Payer: Self-pay | Admitting: General Surgery

## 2012-05-19 ENCOUNTER — Ambulatory Visit (INDEPENDENT_AMBULATORY_CARE_PROVIDER_SITE_OTHER): Payer: Medicare (Managed Care) | Admitting: General Surgery

## 2012-05-19 VITALS — BP 118/64 | HR 70 | Temp 97.9°F | Resp 14 | Ht 63.0 in | Wt 158.2 lb

## 2012-05-19 DIAGNOSIS — C50419 Malignant neoplasm of upper-outer quadrant of unspecified female breast: Secondary | ICD-10-CM

## 2012-05-19 NOTE — Patient Instructions (Signed)
Your right breast and right axillary wounds are healing normally. There is no apparent complication from your recent breast surgery.  You may start her radiation therapy after July 12.  Return to see Dr. Derrell Lolling in 10 weeks.

## 2012-05-19 NOTE — Progress Notes (Signed)
Subjective:     Patient ID: Angela Boyer, female   DOB: 1961/06/30, 51 y.o.   MRN: [id number]  HPI This patient underwent right partial mastectomy, reexcision of anterior superior margin and SLN biopsy on May 04, 2012. Pathology report showed receptor positive ductal carcinoma in situ with negative margins. The sentinel nodes were negative.  She has done well. She has a lot of anxiety but not much pain. She has seen Dr. Dorothy Puffer and is scheduled for simulation next week. She also has a followup with Dr. Marikay Alar Magrinat at some point.  Review of Systems     Objective:   Physical Exam Patient looks well. She is in no distress other than anxiety. Her husband is with her.  Right breast and right axillary wounds are healing normally. Normal amount of thickening in the breast wound. No infection. No drainage. The right axilla is soft. Good range of motion right shoulder. No sensory deficit right arm.    Assessment:     DCIS right breast, upper outer quadrant, receptor positive. Doing well in the early postoperative period following partial mastectomy and sentinel biopsy.    Plan:     Activities and skin care discussed.  Okay to start radiation therapy after July 12  Return to see me in 10 weeks.    [name]. Derrell Lolling, M.D., [facility] General and Minimally invasive Surgery Breast and Colorectal Surgery Office:   [phone] Pager:   [phone]

## 2012-05-30 ENCOUNTER — Telehealth (INDEPENDENT_AMBULATORY_CARE_PROVIDER_SITE_OTHER): Payer: Self-pay | Admitting: *Deleted

## 2012-05-30 NOTE — Telephone Encounter (Signed)
Patient called office and states that she is feeling a lot better since this morning when she called . She says that she has hemorrhoids/fissure and it is very painful. She has used Preparation and this has helped. She is asking if she can make an appointment with Dr.Rehman after she completes radiation for breast cancer. This will be for 7 months.  She will call our office prior to this if her conditions worsen or will go to the ED. She may be reached at [phone] Her Pharmacy is [facility] in [place]

## 2012-06-03 ENCOUNTER — Telehealth: Payer: Self-pay | Admitting: *Deleted

## 2012-06-03 NOTE — Telephone Encounter (Signed)
Pt called very tearful about her dx.  She relayed she needs "motivation".  I have sent a R2R referral and discussed different support groups with her and recommended [employer].I'm also referring her to our pt family and support team.  Gave pt emotional support.  Encourage pt to call with further needs.  Contact information given.

## 2012-06-06 ENCOUNTER — Encounter: Payer: Self-pay | Admitting: Specialist

## 2012-06-06 NOTE — Progress Notes (Signed)
I called the patient at the request of Angela Boyer, who had talked with the patient on July 12.  Angela Boyer said there are days when all she does is sit on the sofa and cry.  She described her many medical concerns, including chronic pain, and how devastating it is to her to have one more thing to deal with.  Angela Boyer has made a referral to [facility], and I encouraged her to attend the Breast Cancer Support Group.  I also encouraged Angela Boyer to review her concerns with Dr. Darnelle Catalan when she sees him.  In addition, I told Angela Boyer about the counseling services available at the [facility] and offered to connect her to counseling if that is something she thinks would be helpful.

## 2012-06-14 ENCOUNTER — Telehealth: Payer: Self-pay | Admitting: Oncology

## 2012-06-14 ENCOUNTER — Ambulatory Visit: Payer: Medicare (Managed Care) | Admitting: Oncology

## 2012-06-14 ENCOUNTER — Ambulatory Visit (HOSPITAL_BASED_OUTPATIENT_CLINIC_OR_DEPARTMENT_OTHER): Payer: Medicare (Managed Care) | Admitting: Oncology

## 2012-06-14 VITALS — BP 128/79 | HR 68 | Temp 98.4°F | Ht 63.0 in | Wt 161.1 lb

## 2012-06-14 DIAGNOSIS — C50419 Malignant neoplasm of upper-outer quadrant of unspecified female breast: Secondary | ICD-10-CM

## 2012-06-14 MED ORDER — VENLAFAXINE HCL ER 37.5 MG PO CP24: 37.5000 mg | ORAL_CAPSULE | Freq: Every day | ORAL | Status: DC

## 2012-06-14 NOTE — Progress Notes (Signed)
ID: Angela Boyer   DOB: 1961/07/15  MR#: [id number]  WUJ#:[id number]  HISTORY OF PRESENT ILLNESS: The patient (goes by "Angela Boyer") had bilateral screening mammography 0502/2013 showing microcalcifications in the right breast. Additional views were obtained of 04/06/2012 and this confirmed a 7 mm cluster of pleomorphic calcifications in the upper outer quadrant of the right breast. Biopsy of the mass was performed the same day, and showed ([id number]) ductal carcinoma in situ, high-grade, estrogen and progesterone receptor both positive at 100%.  The patient proceeded to bilateral breast MRI 04/12/2012 showing only post biopsy change, with no masses or abnormal enhancement suspicious for malignancy in either breast and no adenopathy. Her subsequent history is as detailed below.  INTERVAL HISTORY: The patient returns today for followup of her breast cancer. Today she will receive the seventh of 35 planned radiation treatments.  REVIEW OF SYSTEMS: She did well with her right lumpectomy, but had terrific pain with the injection for sentinel lymph node Marcaine. She says she remembers screaming at that point. Otherwise she had no particular problems with bleeding, infection, or dehiscence. She is concerned because the superb Demetrio Lapping is still in place and she is a ready getting some erythema from her radiation. He takes a 2 hour nap every day after she gets home from radiation. She is having "really bad hot flashes", and cries every morning because of the sweats. They're keeping the temperature at home at 65 and she walks around with a fan. Otherwise a detailed review of systems was stable.  PAST MEDICAL HISTORY: Past Medical History  Diagnosis Date  . Colitis   . Ischemic colitis, enteritis, or enterocolitis   . Hypertension   . GERD (gastroesophageal reflux disease)   . Anxiety   . H/O colonoscopy 2012  . Night sweats   . Wears glasses   . Depression   . Hot flashes   . Burning tongue syndrome     . Breast cancer 05/04/12    right breast lumpectomy=Ductal carcinoma in situw/comedo necrosis&calcifications,ER?PR=positivedx 04/06/12   chronic pain syndrome, the patient receiving all pain medicines through a pain clinic in [place].  PAST SURGICAL HISTORY: Past Surgical History  Procedure Date  . Diagnostic laparoscopy     ischemic colitis  . Colon surgery     partial colectomy during exploratory surg  . 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 Breast lumpectomy 05/04/12    right breast  . Breast biopsy 04/06/12    right breast bx 11 0'clock  . Abdominal hysterectomy 2009    ovaries and tubes intact  . Appendectomy     30 years ago  . Cholecystectomy 2012   she had a ruptured of central nervous system aneurysm approximately 12 years ago, which resulted on her being disabled. She did not have her ovaries removed at the time of her hysterectomy.  FAMILY HISTORY Family History  Problem Relation Age of Onset  . Breast cancer Mother   . Cancer Mother     breast  . Melanoma Father   . Cancer Father     melanoma   The patient's parents are alive. Her mother was diagnosed with breast cancer at the age of 55. She underwent left mastectomy and is doing well currently age 18. The patient's father had what sounds like squamous cell cancers of the face secondary to sun exposure, clearly not melanoma. The patient has one brother, no sisters. There are no other cancers in the immediate family.  GYNECOLOGIC HISTORY:  Menarche age 24, GX P61, with first live birth at age 22. She status post hysterectomy without salpingo-oophorectomy. She took hormone replacement for about 4 months, stopping April 2013.  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 Her husband Kaydense Rizo (goes by Clovis Riley) is a Chiropractor. Daughter Vinetta Bergamo lives in [place] and owns a Education officer, environmental business. Daughter Casi. Cadiente  lives in [place] at and works at a group home for local children. Son Bich Mchaney lives in [place] and workes for the department of [employer]. The patient has 2 grandchildren. She is not a church attender  ADVANCED DIRECTIVES: Not in place  HEALTH MAINTENANCE: History  Substance Use Topics  . Smoking status: Former Smoker -- .5 years    Types: Cigarettes    Quit date: 05/19/1992  . Smokeless tobacco: Never Used  . Alcohol Use: No     Colonoscopy: 2011/[facility]  PAP: s/p hysterectomy  Bone density: never  Lipid panel:  Allergies  Allergen Reactions  . Tape     Thin skin   . Vicodin (Hydrocodone-Acetaminophen) Nausea And Vomiting    Current Outpatient Prescriptions  Medication Sig Dispense Refill  . amLODipine (NORVASC) 10 MG tablet Take 10 mg by mouth daily.        . benazepril (LOTENSIN) 20 MG tablet Take 20 mg by mouth daily.        . clonazePAM (KLONOPIN) 0.5 MG tablet Take 0.5 mg by mouth 2 (two) times daily. Only takes in afternoon, doesn't take in mornings"knocks me out " stated patient 05/18/12      . gabapentin (NEURONTIN) 600 MG tablet Take 600 mg by mouth 2 (two) times daily.        Marland Kitchen omeprazole (PRILOSEC) 40 MG capsule Take 40 mg by mouth daily.        Marland Kitchen oxyCODONE-acetaminophen (PERCOCET) 10-325 MG per tablet Take 1 tablet by mouth every 4 (four) hours as needed.      . zolpidem (AMBIEN) 10 MG tablet Take 10 mg by mouth as needed.       Marland Kitchen LORazepam (ATIVAN) 0.5 MG tablet Take 0.5 mg by mouth as needed. Takes prn when panic attacks occur stated patient 05/18/12        OBJECTIVE: Middle-aged white woman  who appears anxious  Filed Vitals:   06/14/12 1008  BP: 128/79  Pulse: 68  Temp: 98.4 F (36.9 C)     Body mass index is 28.54 kg/(m^2).    ECOG FS:2  Sclerae unicteric Oropharynx clear No peripheral adenopathy Lungs no rales or rhonchi Heart regular rate and rhythm Abd benign MSK no focal spinal tenderness, no peripheral edema Neuro: nonfocal Breasts:  The right breast is status post lumpectomy. The incision is healing nicely. There is a ready some erythema secondary to the radiation. There is no suggestion of local recurrence. Left breast is unremarkable.   LAB RESULTS: Lab Results  Component Value Date   WBC 8.9 05/02/2012   NEUTROABS 8.2* 04/20/2012   HGB 14.0 05/02/2012   HCT 41.6 05/02/2012   MCV 93.5 05/02/2012   PLT 218 05/02/2012      Chemistry      Component Value Date/Time   NA 140 05/02/2012 1320   K 4.8 05/02/2012 1320   CL 106 05/02/2012 1320   CO2 25 05/02/2012 1320   BUN 14 05/02/2012 1320   CREATININE 0.84 05/02/2012 1320   CREATININE 0.80 03/14/2012 1608  Component Value Date/Time   CALCIUM 9.4 05/02/2012 1320   ALKPHOS 80 05/02/2012 1320   AST 24 05/02/2012 1320   ALT 26 05/02/2012 1320   BILITOT 0.4 05/02/2012 1320       Lab Results  Component Value Date   LABCA2 20 05/02/2012    No components found with this basename: [id number]    No results found for this basename: INR:1;PROTIME:1 in the last 168 hours  Urinalysis    Component Value Date/Time   COLORURINE AMBER* 05/02/2012 1254   APPEARANCEUR TURBID* 05/02/2012 1254   LABSPEC 1.029 05/02/2012 1254   PHURINE 5.5 05/02/2012 1254   GLUCOSEU NEGATIVE 05/02/2012 1254   HGBUR NEGATIVE 05/02/2012 1254   BILIRUBINUR SMALL* 05/02/2012 1254   KETONESUR NEGATIVE 05/02/2012 1254   PROTEINUR NEGATIVE 05/02/2012 1254   UROBILINOGEN 0.2 05/02/2012 1254   NITRITE NEGATIVE 05/02/2012 1254   LEUKOCYTESUR TRACE* 05/02/2012 1254    STUDIES: Mr Breast Bilateral W Wo Contrast  04/12/2012  *RADIOLOGY REPORT*  Clinical Data: Recently diagnosed right breast ductal carcinoma in situ.  BILATERAL BREAST MRI WITH AND WITHOUT CONTRAST  Technique: Multiplanar, multisequence MR images of both breasts were obtained prior to and following the intravenous administration of 15ml of MultiHance.  Three dimensional images were evaluated at the independent DynaCad workstation.  Comparison:  Recent  mammogram and biopsy examinations.  Findings: Moderate background parenchymal enhancement in both breasts.  Postbiopsy hematoma in the anterior aspect of the upper outer quadrant of the right breast with no associated mass or abnormal enhancement.  There is adjacent diffuse edema and low grade enhancement extending more inferiorly in the central portion of the breast with some anterior skin thickening and enhancement and diffuse enlargement of the breast compared to the left, not seen on the mammograms prior to the biopsy.  Pre biopsy, the right breast was slightly smaller than the left breast mammographically.  No masses or areas of abnormal enhancement in the left breast.  No abnormal appearing lymph nodes.  IMPRESSION: Post biopsy changes in the right breast with no mass or abnormal enhancement suspicious for malignancy in either breast at this time.  No adenopathy.  THREE-DIMENSIONAL MR IMAGE RENDERING ON INDEPENDENT WORKSTATION:  Three-dimensional MR images were rendered by post-processing of the original MR data on an independent workstation.  The three- dimensional MR images were interpreted, and findings were reported in the accompanying complete MRI report for this study.  BI-RADS CATEGORY 6:  Known biopsy-proven malignancy - appropriate action should be taken.  Recommendation:  Treatment plan.  Original Report Authenticated By: Darrol Angel, M.D.   ASSESSMENT: 51 y.o.  [place] woman status post right lumpectomy and sentinel node biopsy  05/04/2012 for a ductal carcinoma in situ, grade 3, strongly estrogen and progesterone receptor positive, measuring 1.5 cm, with negative and ample margins  PLAN: We spent the better part of today's discussion going over anti-estrogens. She understands aside from reducing the risk of this breast cancer recurring, antiestrogen pills would cut in half the risk of a new breast cancer developing in either breast. We also talked about some of the side effects toxicities  and complications of these medications  Madelline had confused estrogens and anti estrogens, but I hope we got that sorted out today. I gave her written information on tamoxifen and anastrozole and when she returns in September we will be able to discuss those medications in more detail and sort through her decisions, first whether she wants to try an antiestrogen, and second which one  she would prefer.  She really wants something for hot flashes and I wrote her for Effexor XR 37.5 mg. If she gets no benefit after 2 weeks she will let us know and we would double the dose. Otherwise she knows to call for any problems that may develop before the next visit.  MAGRINAT,GUSTAV C    06/14/2012

## 2012-06-14 NOTE — Telephone Encounter (Signed)
gve the pt her sept 2013 appt calendar °

## 2012-06-15 ENCOUNTER — Ambulatory Visit (INDEPENDENT_AMBULATORY_CARE_PROVIDER_SITE_OTHER): Payer: Medicare Other | Admitting: Internal Medicine

## 2012-06-16 ENCOUNTER — Emergency Department (HOSPITAL_COMMUNITY)
Admission: EM | Admit: 2012-06-16 | Discharge: 2012-06-16 | Disposition: A | Discharge status: Home / Self Care | Payer: Medicare (Managed Care) | Attending: Emergency Medicine | Admitting: Emergency Medicine

## 2012-06-16 ENCOUNTER — Ambulatory Visit (INDEPENDENT_AMBULATORY_CARE_PROVIDER_SITE_OTHER): Payer: Self-pay | Admitting: Internal Medicine

## 2012-06-16 ENCOUNTER — Encounter (HOSPITAL_COMMUNITY): Payer: Self-pay | Admitting: *Deleted

## 2012-06-16 ENCOUNTER — Emergency Department (HOSPITAL_COMMUNITY): Payer: Medicare (Managed Care)

## 2012-06-16 DIAGNOSIS — K219 Gastro-esophageal reflux disease without esophagitis: Secondary | ICD-10-CM | POA: Insufficient documentation

## 2012-06-16 DIAGNOSIS — F329 Major depressive disorder, single episode, unspecified: Secondary | ICD-10-CM | POA: Insufficient documentation

## 2012-06-16 DIAGNOSIS — Z79899 Other long term (current) drug therapy: Secondary | ICD-10-CM | POA: Insufficient documentation

## 2012-06-16 DIAGNOSIS — F3289 Other specified depressive episodes: Secondary | ICD-10-CM | POA: Insufficient documentation

## 2012-06-16 DIAGNOSIS — K5289 Other specified noninfective gastroenteritis and colitis: Secondary | ICD-10-CM | POA: Insufficient documentation

## 2012-06-16 DIAGNOSIS — K529 Noninfective gastroenteritis and colitis, unspecified: Secondary | ICD-10-CM

## 2012-06-16 DIAGNOSIS — F411 Generalized anxiety disorder: Secondary | ICD-10-CM | POA: Insufficient documentation

## 2012-06-16 DIAGNOSIS — Z87891 Personal history of nicotine dependence: Secondary | ICD-10-CM | POA: Insufficient documentation

## 2012-06-16 DIAGNOSIS — I10 Essential (primary) hypertension: Secondary | ICD-10-CM | POA: Insufficient documentation

## 2012-06-16 DIAGNOSIS — Z9089 Acquired absence of other organs: Secondary | ICD-10-CM | POA: Insufficient documentation

## 2012-06-16 DIAGNOSIS — Z853 Personal history of malignant neoplasm of breast: Secondary | ICD-10-CM | POA: Insufficient documentation

## 2012-06-16 LAB — CBC WITH DIFFERENTIAL/PLATELET
Basophils Relative: 0 % (ref 0–1)
Eosinophils Absolute: 0 10*3/uL (ref 0.0–0.7)
Eosinophils Relative: 0 % (ref 0–5)
HCT: 45.1 % (ref 36.0–46.0)
Hemoglobin: 15.8 g/dL — ABNORMAL HIGH (ref 12.0–15.0)
MCH: 32.4 pg (ref 26.0–34.0)
MCHC: 35 g/dL (ref 30.0–36.0)
MCV: 92.4 fL (ref 78.0–100.0)
Monocytes Absolute: 0.7 10*3/uL (ref 0.1–1.0)
Monocytes Relative: 7 % (ref 3–12)

## 2012-06-16 LAB — URINALYSIS, ROUTINE W REFLEX MICROSCOPIC
Glucose, UA: NEGATIVE mg/dL
Leukocytes, UA: NEGATIVE
Nitrite: NEGATIVE
Protein, ur: NEGATIVE mg/dL
pH: 6 (ref 5.0–8.0)

## 2012-06-16 LAB — COMPREHENSIVE METABOLIC PANEL
Albumin: 3.9 g/dL (ref 3.5–5.2)
BUN: 13 mg/dL (ref 6–23)
Creatinine, Ser: 0.74 mg/dL (ref 0.50–1.10)
GFR calc Af Amer: >90 mL/min (ref >90)
Glucose, Bld: 108 mg/dL — ABNORMAL HIGH (ref 70–99)
Total Protein: 7.6 g/dL (ref 6.0–8.3)

## 2012-06-16 MED ORDER — HYDROMORPHONE HCL PF 1 MG/ML IJ SOLN
1.0000 mg | Freq: Once | INTRAMUSCULAR | Status: AC
  Administered 2012-06-16: 1 mg via INTRAVENOUS
  Filled 2012-06-16: qty 1

## 2012-06-16 MED ORDER — ONDANSETRON HCL 4 MG/2ML IJ SOLN
4.0000 mg | Freq: Once | INTRAMUSCULAR | Status: AC
  Administered 2012-06-16: 4 mg via INTRAVENOUS
  Filled 2012-06-16: qty 2

## 2012-06-16 MED ORDER — METRONIDAZOLE 500 MG PO TABS: 500.0000 mg | ORAL_TABLET | Freq: Two times a day (BID) | ORAL | Status: AC

## 2012-06-16 MED ORDER — ONDANSETRON HCL 4 MG/2ML IJ SOLN
4.0000 mg | Freq: Once | INTRAMUSCULAR | Status: AC
  Administered 2012-06-16: 4 mg via INTRAVENOUS

## 2012-06-16 MED ORDER — CIPROFLOXACIN HCL 500 MG PO TABS: 500.0000 mg | ORAL_TABLET | Freq: Two times a day (BID) | ORAL | Status: AC

## 2012-06-16 MED ORDER — SODIUM CHLORIDE 0.9 % IV BOLUS (SEPSIS)
1000.0000 mL | Freq: Once | INTRAVENOUS | Status: AC
  Administered 2012-06-16: 1000 mL via INTRAVENOUS

## 2012-06-16 MED ORDER — PANTOPRAZOLE SODIUM 40 MG IV SOLR
40.0000 mg | Freq: Once | INTRAVENOUS | Status: AC
  Administered 2012-06-16: 40 mg via INTRAVENOUS
  Filled 2012-06-16: qty 40

## 2012-06-16 MED ORDER — IOHEXOL 300 MG/ML  SOLN
100.0000 mL | Freq: Once | INTRAMUSCULAR | Status: AC | PRN
  Administered 2012-06-16: 100 mL via INTRAVENOUS

## 2012-06-16 MED ORDER — ONDANSETRON HCL 4 MG/2ML IJ SOLN
INTRAMUSCULAR | Status: AC
  Administered 2012-06-16: 4 mg via INTRAVENOUS
  Filled 2012-06-16: qty 2

## 2012-06-16 NOTE — ED Notes (Signed)
Pt states pain is returning. edp aware and in room to reeval

## 2012-06-16 NOTE — ED Notes (Signed)
Patient with no complaints at this time. Respirations even and unlabored. Skin warm/dry. Discharge instructions reviewed with patient at this time. Patient given opportunity to voice concerns/ask questions. IV removed per policy and band-aid applied to site. Patient discharged at this time and left Emergency Department with steady gait.  

## 2012-06-16 NOTE — ED Notes (Signed)
Patient relates she took Effexor for first time last night at about 5 pm. Shortly after began feeling nauseated.  This am at approx 9 am, began having diarrhea.  She has had 8/35 radiation therapy treatments.  Was to go today for #9.

## 2012-06-16 NOTE — Progress Notes (Signed)
6:37 PM CT of abdomen/pelvis shows colitis in distal 1/2 of colon.  Advised Rx with Cipro 500 mg bid and Flagyl 500 mg bid, Followup with Dr. Karilyn Cota, her gastroenterologist.

## 2012-06-16 NOTE — ED Provider Notes (Signed)
History     CSN: [id number]  Arrival date & time 06/16/12  1112   First MD Initiated Contact with Patient 06/16/12 1224      Chief Complaint  Patient presents with  . Diarrhea  . Nausea    (Consider location/radiation/quality/duration/timing/severity/associated sxs/prior treatment) HPI.....nausea, vomiting, diarrhea, right upper quadrant abdominal pain, weakness, shaking today. Presently patient just finished her eighth day of radiation therapy for right-sided breast cancer. Past history includes a cholecystectomy, appendectomy, hysterectomy, lumpectomy,  removal of part of her bowel secondary to colitis.  She feels dehydrated. Nothing makes symptoms better or worse. Severity is moderate to severe  Past Medical History  Diagnosis Date  . Colitis   . Ischemic colitis, enteritis, or enterocolitis   . Hypertension   . GERD (gastroesophageal reflux disease)   . Anxiety   . H/O colonoscopy 2012  . Night sweats   . Wears glasses   . Depression   . Hot flashes   . Burning tongue syndrome   . Breast cancer 05/04/12    right breast lumpectomy=Ductal carcinoma in situw/comedo necrosis&calcifications,ER?PR=positivedx 04/06/12    Past Surgical History  Procedure Date  . Diagnostic laparoscopy     ischemic colitis  . Colon surgery     partial colectomy during exploratory surg  . 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 Breast lumpectomy 05/04/12    right breast  . Breast biopsy 04/06/12    right breast bx 11 0'clock  . Abdominal hysterectomy 2009    ovaries and tubes intact  . Appendectomy     30 years ago  . Cholecystectomy 2012    Family History  Problem Relation Age of Onset  . Breast cancer Mother   . Cancer Mother     breast  . Hypertension Mother   . Melanoma Father   . Cancer Father     melanoma  . Diabetes Father   . Hypertension Father   . Hypertension Brother     History  Substance Use Topics  . Smoking status: Former Smoker -- .5 years   Types: Cigarettes    Quit date: 05/19/1992  . Smokeless tobacco: Never Used  . Alcohol Use: No    OB History    Grav Para Term Preterm Abortions TAB SAB Ect Mult Living   1 1 1             Obstetric Comments   Menses age 41, pregnancy age 84, 2 step children      Review of Systems  All other systems reviewed and are negative.    Allergies  Tape and Vicodin  Home Medications   Current Outpatient Rx  Name Route Sig Dispense Refill  . AMLODIPINE BESYLATE 10 MG PO TABS Oral Take 10 mg by mouth daily.      Marland Kitchen BENAZEPRIL HCL 20 MG PO TABS Oral Take 20 mg by mouth daily.      Marland Kitchen CLONAZEPAM 0.5 MG PO TABS Oral Take 0.5 mg by mouth daily.     Marland Kitchen GABAPENTIN 600 MG PO TABS Oral Take 600 mg by mouth 2 (two) times daily.      Marland Kitchen LORAZEPAM 0.5 MG PO TABS Oral Take 0.5 mg by mouth as needed. Panic Attacks    . OMEPRAZOLE 40 MG PO CPDR Oral Take 40 mg by mouth daily.      . OXYCODONE-ACETAMINOPHEN 10-325 MG PO TABS Oral Take 1 tablet by mouth every 4 (four) hours as needed. Pain    .  VENLAFAXINE HCL ER 37.5 MG PO CP24 Oral Take 37.5 mg by mouth daily.    Marland Kitchen ZOLPIDEM TARTRATE 10 MG PO TABS Oral Take 10 mg by mouth as needed. Sleep      BP 129/87  Pulse 109  Temp 98.1 F (36.7 C) (Oral)  Resp 24  Ht 5\' 3"  (1.6 m)  Wt 160 lb (72.576 kg)  BMI 28.34 kg/m2  SpO2 98%  Physical Exam  Nursing note and vitals reviewed. Constitutional: She is oriented to person, place, and time.       Pale and dehydrated.  HENT:  Head: Normocephalic and atraumatic.  Eyes: Conjunctivae and EOM are normal. Pupils are equal, round, and reactive to light.  Neck: Normal range of motion. Neck supple.  Cardiovascular: Normal rate and regular rhythm.   Pulmonary/Chest: Effort normal and breath sounds normal.  Abdominal: Soft. Bowel sounds are normal.       Right upper quadrant tenderness  Musculoskeletal: Normal range of motion.  Neurological: She is alert and oriented to person, place, and time.  Skin: Skin is  warm and dry.  Psychiatric: She has a normal mood and affect.    ED Course  Procedures (including critical care time)  Labs Reviewed  CBC WITH DIFFERENTIAL - Abnormal; Notable for the following:    Hemoglobin 15.8 (*)     All other components within normal limits  COMPREHENSIVE METABOLIC PANEL - Abnormal; Notable for the following:    Glucose, Bld 108 (*)     AST 95 (*)     ALT 92 (*)     Alkaline Phosphatase 121 (*)     All other components within normal limits  URINALYSIS, ROUTINE W REFLEX MICROSCOPIC - Abnormal; Notable for the following:    Ketones, ur 15 (*)     All other components within normal limits  CLOSTRIDIUM DIFFICILE BY PCR  CULTURE, BLOOD (ROUTINE X 2)  CULTURE, BLOOD (ROUTINE X 2)   Dg Chest Port 1 View  06/16/2012  *RADIOLOGY REPORT*  Clinical Data: Evaluate for pneumonia.  patient receiving radiation therapy.  PORTABLE CHEST - 1 VIEW  Comparison: 05/02/2012.  Findings: Surgical clips are present in the right breast, likely associated with lumpectomy.  There is no airspace disease.  No effusion.  Bilateral pleural apical scarring appears similar to the prior exam.  Cardiopericardial silhouette normal.  IMPRESSION: No active cardiopulmonary disease.  Original Report Authenticated By: Andreas Newport, M.D.     No diagnosis found.    MDM  Patient feels better after hydration and pain management. Elevated liver functions noted. CT abdomen and pelvis ordered at 1545. Case discussed with Dr. Ignacia Palma.        Donnetta Hutching, MD 06/16/12 1547

## 2012-06-21 LAB — CULTURE, BLOOD (ROUTINE X 2)
Culture: NO GROWTH
Culture: NO GROWTH

## 2012-06-27 ENCOUNTER — Encounter (INDEPENDENT_AMBULATORY_CARE_PROVIDER_SITE_OTHER): Payer: Self-pay | Admitting: Internal Medicine

## 2012-06-27 ENCOUNTER — Ambulatory Visit (INDEPENDENT_AMBULATORY_CARE_PROVIDER_SITE_OTHER): Payer: Medicare (Managed Care) | Admitting: Internal Medicine

## 2012-06-27 VITALS — BP 84/54 | HR 66 | Temp 97.9°F | Ht 63.0 in | Wt 163.7 lb

## 2012-06-27 DIAGNOSIS — R7401 Elevation of levels of liver transaminase levels: Secondary | ICD-10-CM

## 2012-06-27 DIAGNOSIS — R748 Abnormal levels of other serum enzymes: Secondary | ICD-10-CM

## 2012-06-27 NOTE — Progress Notes (Signed)
Subjective:     Patient ID: Angela Boyer, female   DOB: 25-Jan-1961, 51 y.o.   MRN: [id number]  HPI Here today for f/u.  She was recently seen in the ED for abdominal pain and diarrhea. She was extremely weak.  She was placed on Cipro and Flagyl x 7 days.  She feels better now. She has a hx of ischemic colitis.   She is taking Imodium for loose, mushy stools. She was recently diagnosed with rt breast cancer and has undergone a rt lumpectomy and is presently undergoing radiation. She  Has 21 rounds of radiation left Appetite is good. No weight loss.  No abdominal now.   Cholecystectomy 08/2011 for gallstones in [place]. She had an ERCP 2 days after surgery fpr stones in the [place].   Last colonoscopy 2012  by Dr. Randa Evens which was normal. Hx of polyps(tubularvillous adenoma)  Hx of colitis and ischemic colitis.     06/16/2012: CT abdomen/pelvis with CM: IMPRESSION:  1. Diffuse steatosis of the liver without evidence of focal  hepatic lesions.  2. Colonic thickening involving the distal half of the colon  suggestive of some type of underlying colitis. No evidence of  associated perforation, obstruction or abscess.   CMP     Component Value Date/Time   NA 136 06/16/2012 1115   K 3.8 06/16/2012 1115   CL 101 06/16/2012 1115   CO2 26 06/16/2012 1115   GLUCOSE 108* 06/16/2012 1115   BUN 13 06/16/2012 1115   CREATININE 0.74 06/16/2012 1115   CREATININE 0.80 03/14/2012 1608   CALCIUM 10.2 06/16/2012 1115   PROT 7.6 06/16/2012 1115   ALBUMIN 3.9 06/16/2012 1115   AST 95* 06/16/2012 1115   ALT 92* 06/16/2012 1115   ALKPHOS 121* 06/16/2012 1115   BILITOT 0.7 06/16/2012 1115   GFRNONAA >90 06/16/2012 1115   GFRAA >90 06/16/2012 1115    CBC    Component Value Date/Time   WBC 9.1 06/16/2012 1115   WBC 10.6* 04/20/2012 1214   RBC 4.88 06/16/2012 1115   RBC 4.48 04/20/2012 1214   HGB 15.8* 06/16/2012 1115   HGB 14.3 04/20/2012 1214   HCT 45.1 06/16/2012 1115   HCT 42.5 04/20/2012 1214   PLT 264  06/16/2012 1115   PLT 216 04/20/2012 1214   MCV 92.4 06/16/2012 1115   MCV 95.0 04/20/2012 1214   MCH 32.4 06/16/2012 1115   MCH 32.0 04/20/2012 1214   MCHC 35.0 06/16/2012 1115   MCHC 33.7 04/20/2012 1214   RDW 12.9 06/16/2012 1115   RDW 13.6 04/20/2012 1214   LYMPHSABS 1.5 06/16/2012 1115   LYMPHSABS 1.6 04/20/2012 1214   MONOABS 0.7 06/16/2012 1115   MONOABS 0.7 04/20/2012 1214   EOSABS 0.0 06/16/2012 1115   EOSABS 0.1 04/20/2012 1214   BASOSABS 0.0 06/16/2012 1115   BASOSABS 0.0 04/20/2012 1214       Review of Systems Current Outpatient Prescriptions  Medication Sig Dispense Refill  . amLODipine (NORVASC) 10 MG tablet Take 10 mg by mouth daily.        . benazepril (LOTENSIN) 20 MG tablet Take 20 mg by mouth daily.        . clonazePAM (KLONOPIN) 0.5 MG tablet Take 0.5 mg by mouth daily.       Marland Kitchen gabapentin (NEURONTIN) 600 MG tablet Take 600 mg by mouth 2 (two) times daily.        Marland Kitchen LORazepam (ATIVAN) 0.5 MG tablet Take 0.5 mg by mouth as needed. Panic  Attacks      . omeprazole (PRILOSEC) 40 MG capsule Take 40 mg by mouth daily.        Marland Kitchen oxyCODONE-acetaminophen (PERCOCET) 10-325 MG per tablet Take 1 tablet by mouth every 4 (four) hours as needed. Pain      . zolpidem (AMBIEN) 10 MG tablet Take 10 mg by mouth as needed. Sleep      . ciprofloxacin (CIPRO) 500 MG tablet Take 1 tablet (500 mg total) by mouth 2 (two) times daily.  14 tablet  0  . metroNIDAZOLE (FLAGYL) 500 MG tablet Take 1 tablet (500 mg total) by mouth 2 (two) times daily.  14 tablet  0  . venlafaxine XR (EFFEXOR-XR) 37.5 MG 24 hr capsule Take 37.5 mg by mouth daily.       Past Medical History  Diagnosis Date  . Colitis   . Ischemic colitis, enteritis, or enterocolitis   . Hypertension   . GERD (gastroesophageal reflux disease)   . Anxiety   . H/O colonoscopy 2012  . Night sweats   . Wears glasses   . Depression   . Hot flashes   . Burning tongue syndrome   . Breast cancer 05/04/12    right breast lumpectomy=Ductal  carcinoma in situw/comedo necrosis&calcifications,ER?PR=positivedx 04/06/12  . Breast cancer    Past Surgical History  Procedure Date  . Diagnostic laparoscopy     ischemic colitis  . Colon surgery     partial colectomy during exploratory surg  . 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 Breast lumpectomy 05/04/12    right breast  . Breast biopsy 04/06/12    right breast bx 11 0'clock  . Abdominal hysterectomy 2009    ovaries and tubes intact  . Appendectomy     30 years ago  . Cholecystectomy 2012   Family Status  Relation Status Death Age  . Mother Alive     CABG, CAD, Stents in legs, Breast cancer  . Father Alive     diabetes, hypertension, high cholesterol  . Brother Alive     overweight, hypertension   History   Social History  . Marital Status: Married    Spouse Name: N/A    Number of Children: N/A  . Years of Education: N/A   Occupational History  . Not on file.   Social History Main Topics  . Smoking status: Former Smoker -- .5 years    Types: Cigarettes    Quit date: 05/19/1992  . Smokeless tobacco: Never Used  . Alcohol Use: No  . Drug Use: No     quit smoking 20 years ago  . Sexually Active: Yes    Birth Control/ Protection: None   Other Topics Concern  . Not on file   Social History Narrative  . No narrative on file   Allergies  Allergen Reactions  . Tape     Thin skin   . Vicodin (Hydrocodone-Acetaminophen) Nausea And Vomiting          Objective:   Physical Exam Filed Vitals:   06/27/12 1439  Height: 5\' 3"  (1.6 m)  Weight: 163 lb 11.2 oz (74.254 kg)  Alert and oriented. Skin warm and dry. Oral mucosa is moist.   . Sclera anicteric, conjunctivae is pink. Thyroid not enlarged. No cervical lymphadenopathy. Lungs clear. Heart regular rate and rhythm.  Abdomen is soft. Bowel sounds are positive. No hepatomegaly. No abdominal masses felt. No tenderness.  No edema to lower extremities.  Assessment:   Hx of ishcemic  colitis. Hypotension. Elevated liver enzymes per ED record.    Plan:   No abdominal pain. Possible ischemic episode from Hypotension.  Hold Lotension 20mg .  Norvasc 5mg  daily. Continue present medication.  OV in 1 month with BP check.   Will check a cmet today for her elevated liver enzymes. BP diary.

## 2012-06-27 NOTE — Patient Instructions (Signed)
Stop the Lotensin. B/p checks daily

## 2012-06-28 ENCOUNTER — Ambulatory Visit (HOSPITAL_COMMUNITY)
Admission: RE | Admit: 2012-06-28 | Discharge: 2012-06-28 | Disposition: A | Discharge status: Home / Self Care | Payer: Medicare (Managed Care) | Source: Ambulatory Visit | Attending: Internal Medicine | Admitting: Internal Medicine

## 2012-06-28 ENCOUNTER — Telehealth (INDEPENDENT_AMBULATORY_CARE_PROVIDER_SITE_OTHER): Payer: Self-pay | Admitting: Internal Medicine

## 2012-06-28 ENCOUNTER — Other Ambulatory Visit (INDEPENDENT_AMBULATORY_CARE_PROVIDER_SITE_OTHER): Payer: Self-pay | Admitting: Internal Medicine

## 2012-06-28 DIAGNOSIS — K7689 Other specified diseases of liver: Secondary | ICD-10-CM | POA: Insufficient documentation

## 2012-06-28 DIAGNOSIS — R7401 Elevation of levels of liver transaminase levels: Secondary | ICD-10-CM

## 2012-06-28 DIAGNOSIS — R7402 Elevation of levels of lactic acid dehydrogenase (LDH): Secondary | ICD-10-CM | POA: Insufficient documentation

## 2012-06-28 LAB — COMPREHENSIVE METABOLIC PANEL
ALT: 478 U/L — ABNORMAL HIGH (ref 0–35)
AST: 510 U/L — ABNORMAL HIGH (ref 0–37)
Creat: 0.71 mg/dL (ref 0.50–1.10)
Sodium: 137 mEq/L (ref 135–145)
Total Bilirubin: 0.5 mg/dL (ref 0.3–1.2)

## 2012-06-28 NOTE — Telephone Encounter (Signed)
Results of lab given to patient.   Angela Boyer needs an abdominal US look at bile duct. Elevated transaminases.  Need [place] today.  If duct is dilated she will need an ERCP Wednesday.

## 2012-06-28 NOTE — Telephone Encounter (Signed)
Patient has negative [place]. Will work up to see if she has an autoimmune hepatitis or Hepatitis B or C

## 2012-06-29 ENCOUNTER — Telehealth (INDEPENDENT_AMBULATORY_CARE_PROVIDER_SITE_OTHER): Payer: Self-pay | Admitting: *Deleted

## 2012-06-29 DIAGNOSIS — R7401 Elevation of levels of liver transaminase levels: Secondary | ICD-10-CM

## 2012-06-29 LAB — SEDIMENTATION RATE: Sed Rate: 14 mm/hr (ref 0–22)

## 2012-06-29 NOTE — Telephone Encounter (Signed)
Per Delrae Rend the patient will need to have C-Met week of August 12.

## 2012-07-06 LAB — COMPREHENSIVE METABOLIC PANEL
ALT: 128 U/L — ABNORMAL HIGH (ref 0–35)
Albumin: 4.3 g/dL (ref 3.5–5.2)
CO2: 30 mEq/L (ref 19–32)
Calcium: 9.7 mg/dL (ref 8.4–10.5)
Chloride: 102 mEq/L (ref 96–112)
Potassium: 4.5 mEq/L (ref 3.5–5.3)
Sodium: 140 mEq/L (ref 135–145)
Total Protein: 7 g/dL (ref 6.0–8.3)

## 2012-07-19 ENCOUNTER — Ambulatory Visit (INDEPENDENT_AMBULATORY_CARE_PROVIDER_SITE_OTHER): Payer: Medicare (Managed Care) | Admitting: Internal Medicine

## 2012-07-28 ENCOUNTER — Ambulatory Visit (INDEPENDENT_AMBULATORY_CARE_PROVIDER_SITE_OTHER): Payer: Medicare (Managed Care) | Admitting: Internal Medicine

## 2012-07-28 ENCOUNTER — Encounter (INDEPENDENT_AMBULATORY_CARE_PROVIDER_SITE_OTHER): Payer: Self-pay | Admitting: General Surgery

## 2012-07-28 ENCOUNTER — Ambulatory Visit (INDEPENDENT_AMBULATORY_CARE_PROVIDER_SITE_OTHER): Payer: Medicare (Managed Care) | Admitting: General Surgery

## 2012-07-28 VITALS — BP 102/78 | HR 100 | Temp 98.0°F | Resp 16 | Ht 63.0 in | Wt 164.0 lb

## 2012-07-28 DIAGNOSIS — C50419 Malignant neoplasm of upper-outer quadrant of unspecified female breast: Secondary | ICD-10-CM

## 2012-07-28 DIAGNOSIS — L589 Radiodermatitis, unspecified: Secondary | ICD-10-CM

## 2012-07-28 IMAGING — US US ABDOMEN COMPLETE
1 series · 14 of 25 positions shown · non-contrast
Comparison: RUQ ultrasound dated 08/31/2011.  CT abdomen pelvis
dated 08/29/2011.

CLINICAL DATA: Right upper quadrant pain

COMPLETE ABDOMINAL ULTRASOUND

[Series 1: us abdomen complete · 0.28mm/px · 14 of 79 slices shown]
[im 1/79]
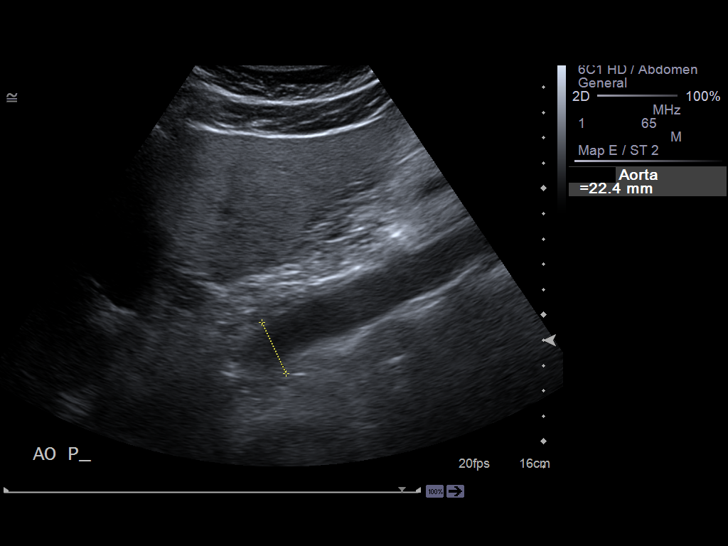
[im 7/79]
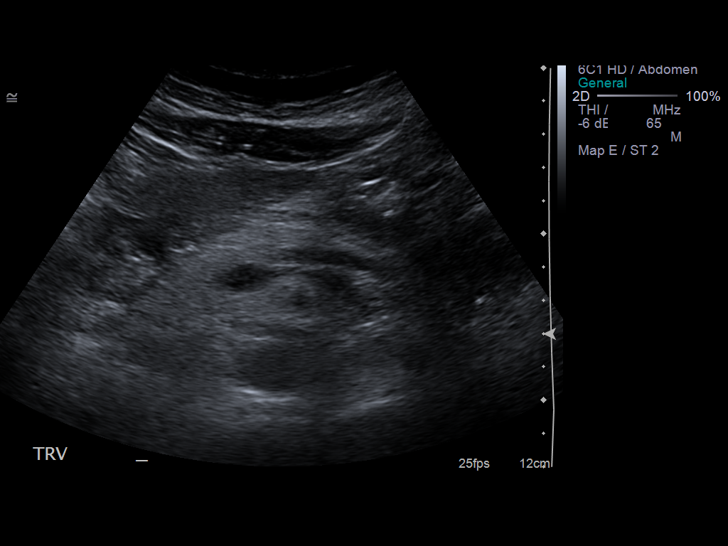
[im 14/79]
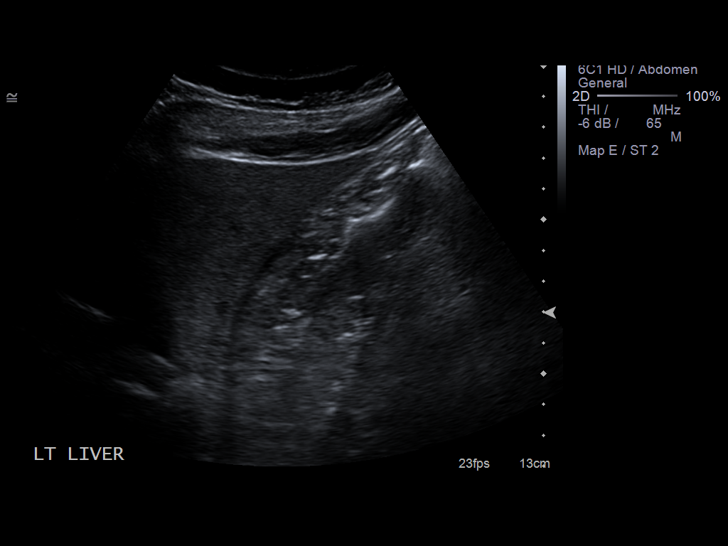
[im 20/79]
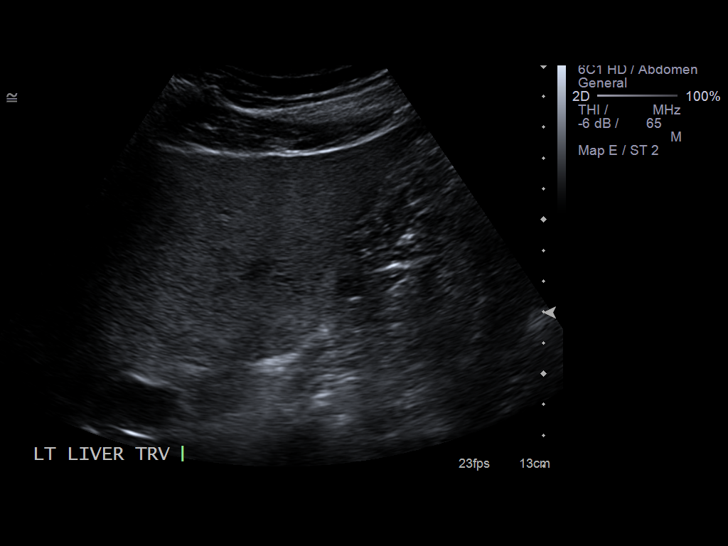
[im 27/79]
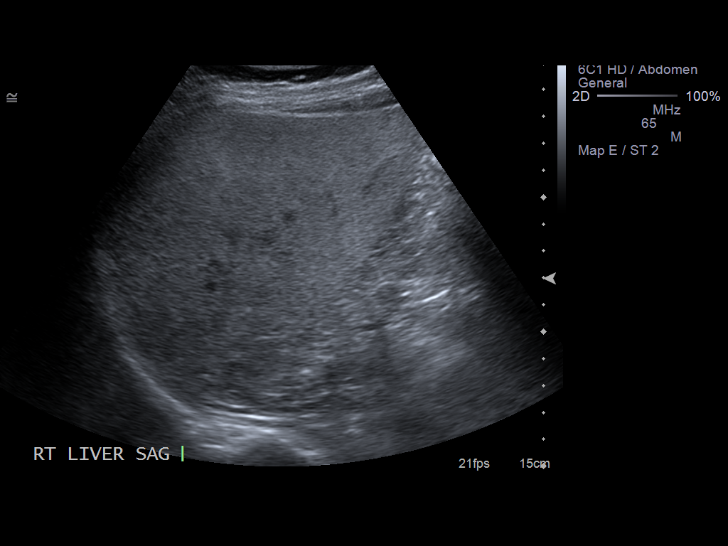
[im 30/79]
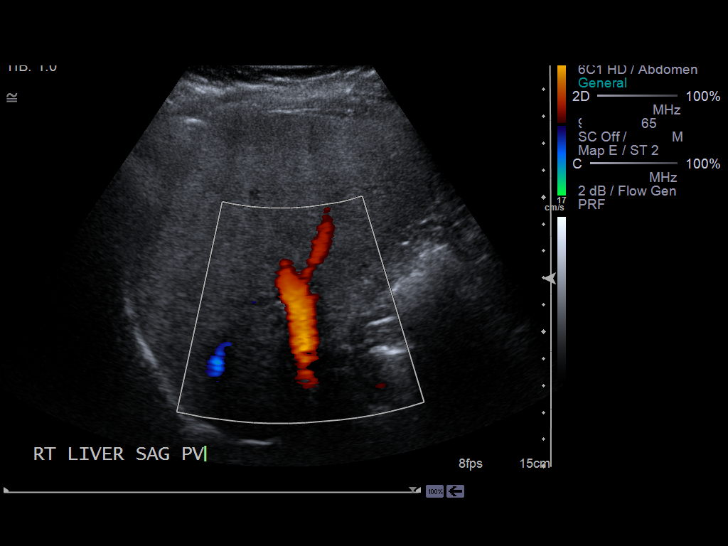
[im 36/79]
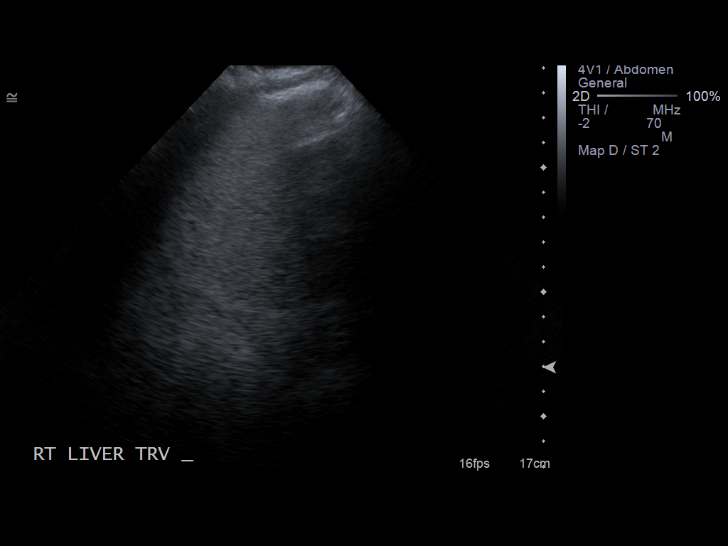
[im 43/79]
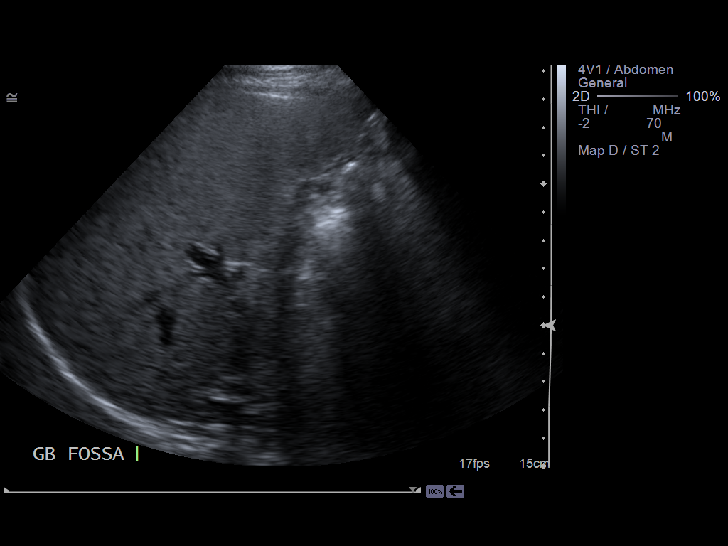
[im 49/79]
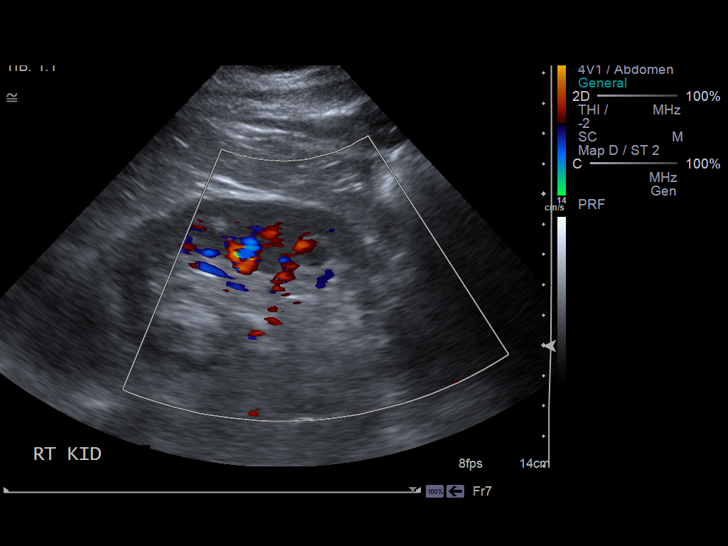
[im 53/79]
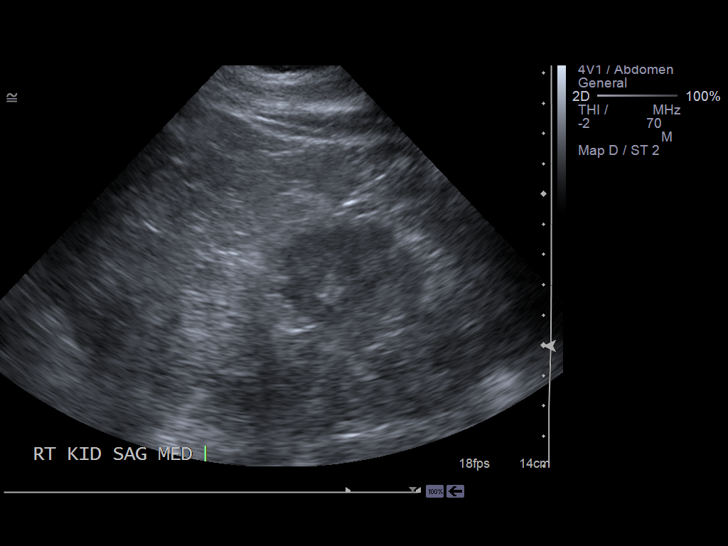
[im 59/79]
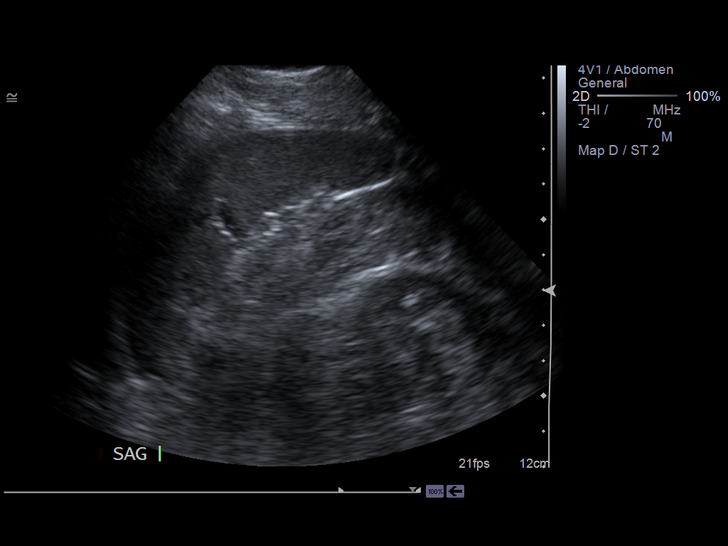
[im 66/79]
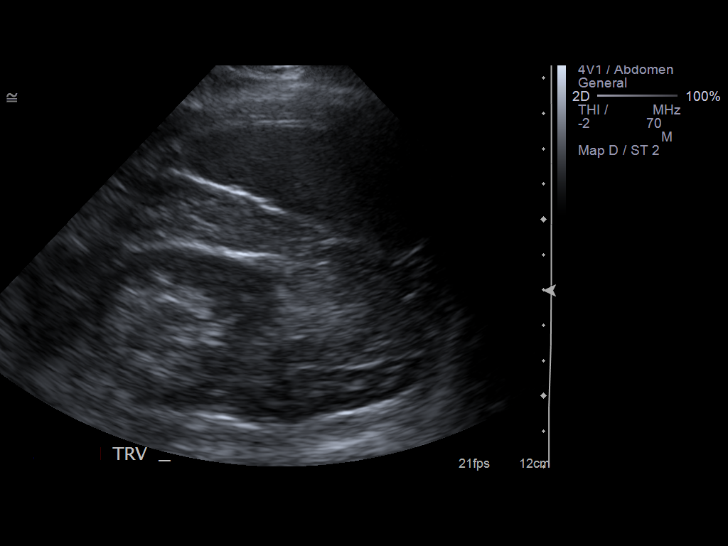
[im 72/79]
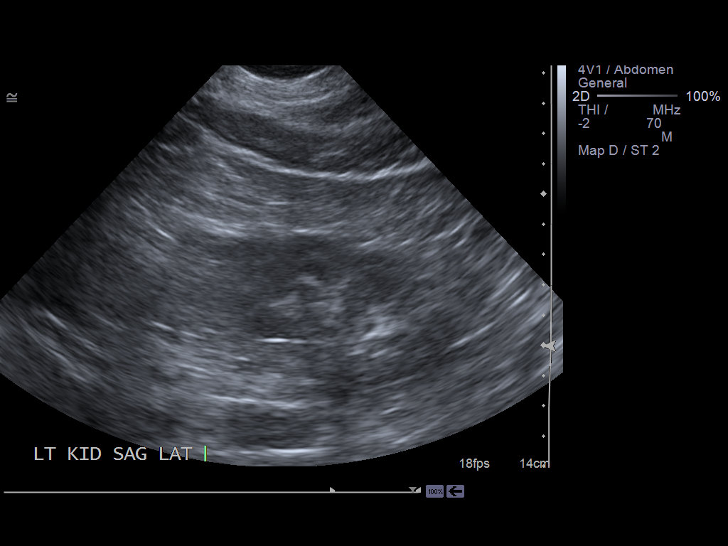
[im 79/79]
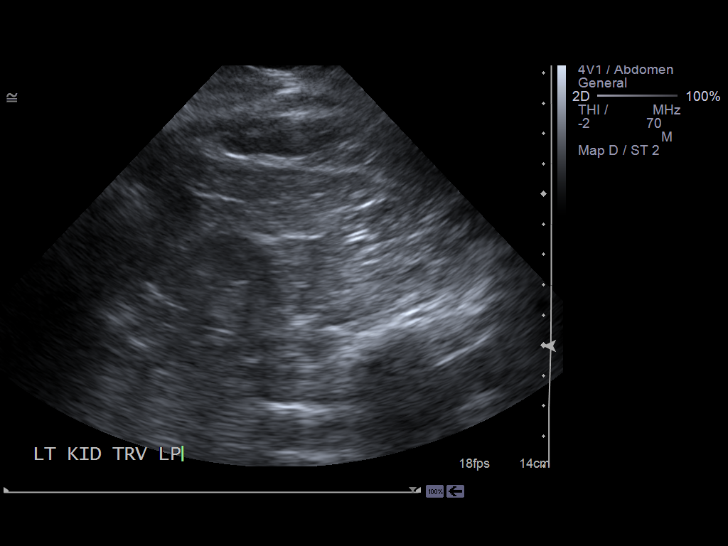

[14 of 25 positions shown; findings below may reference images not displayed]

FINDINGS: Gallbladder:  Surgically absent.

Common bile duct:  Measures 6 mm.

Liver:  Hyperechoic hepatic parenchyma, likely reflecting hepatic
steatosis.  No focal hepatic lesion is seen.

IVC:  Appears normal.

Pancreas:  Incompletely visualized but grossly unremarkable.

Spleen:  Measures 6.8 cm.

Right Kidney:  Measures 10.3 cm.  No mass or hydronephrosis.

Left Kidney:  Measures 12.1 cm.  No mass or hydronephrosis.

Abdominal aorta:  No aneurysm identified.
IMPRESSION: Hepatic steatosis.

Status post cholecystectomy.  Common duct measures 6 mm.

## 2012-07-28 NOTE — Progress Notes (Signed)
Patient ID: [name], female   DOB: [birth year], 51 y.o.   MRN: [id number] History: Patient returns to the office today for a right breast check. She just finished her radiation therapy and she's having a lot of pain and redness of the right breast. She is using some type of cream but that is causing the burning in her skin to be worse. She had a right partial mastectomy and sentinel node biopsy on 05/04/2012 for a 1.5 cm area of ductal carcinoma in situ, receptor positive. Pathologic stage Tis, N0. Upper-outer quadrant. She just finished her radiation therapy. She is followed by Dr. Mitzi Hansen and Dr. Darnelle Catalan.  Exam: Patient is alert but anxious. Her husband is with her. Right breast is examined. She has a very sharply demarcated area of erythema which looks like the booster dose surrounding the lumpectomy site. I do not see  an abscess or secondary infection. She is tender. Only slightly moist. No exfoliation.  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 2 months postop right partial mastectomy and sentinel node biopsy.  Plan: Silvadene cream to right breast 3 times a day. Prescription given She has narcotic pain medication at home. Reassured. Wound care discussed and instructions given She will followup with Dr. Mitzi Hansen and Dr. Darnelle Catalan in the near future. She has not yet decided on antiestrogen therapy Return to see me in 3 months, sooner if there is any problems.   [name]. Derrell Lolling, M.D., [facility] General and Minimally invasive Surgery Breast and Colorectal Surgery Office:   [phone] Pager:   [phone]

## 2012-07-28 NOTE — Patient Instructions (Signed)
The pain and redness that you have in your right breast is due to radiation dermatitis from the booster dose. There is no evidence of infection.  Use the Silvadene cream that I gave you and apply a thin film 3 times daily. Do not use any tape. Do not use ice pack or heating pad.  Call Dr. Mitzi Hansen if this gets worse.  Return to see Dr. Derrell Lolling in 3 months.

## 2012-08-02 ENCOUNTER — Ambulatory Visit (INDEPENDENT_AMBULATORY_CARE_PROVIDER_SITE_OTHER): Payer: Medicare (Managed Care) | Admitting: Internal Medicine

## 2012-08-02 ENCOUNTER — Encounter (INDEPENDENT_AMBULATORY_CARE_PROVIDER_SITE_OTHER): Payer: Self-pay | Admitting: Internal Medicine

## 2012-08-02 VITALS — BP 92/60 | HR 60 | Temp 98.0°F | Ht 62.0 in | Wt 166.5 lb

## 2012-08-02 DIAGNOSIS — R748 Abnormal levels of other serum enzymes: Secondary | ICD-10-CM

## 2012-08-02 DIAGNOSIS — R7401 Elevation of levels of liver transaminase levels: Secondary | ICD-10-CM

## 2012-08-02 NOTE — Progress Notes (Signed)
Subjective:     Patient ID: Angela Boyer, female   DOB: 1961/06/21, 51 y.o.   MRN: [id number]  HPIDelores is a 51 yr old female here today for f/u.  Recently seen in the ED for abdominal pain and diarrhea. CT revealed colonic thickening involving the distal half of the colon suggestive of some type of underlying colitis.She was placed on on Cipro and Flagyl for 7 days. She has a hx of ischemic colitis.  Her last office visit, her B/p was low.  In the ED her liver enzymes were elevated ? Related to her ischemic colitis. She tells me she is tired. She has new diagnose of rt breast cancer and has just finished radiations treatments. Her appetite is good. No weight loss. She has actually gained weight.    Cholecystectomy 08/2011 for gallstones in [place]. She had an ERCP 2 days after surgery fpr stones in the CBD.  Last colonoscopy 2012 by Dr. Randa Evens which was normal. Hx of polyps(tubularvillous adenoma)  Hx of colitis and ischemic colitis.    06/16/2012: CT abdomen/pelvis with CM: IMPRESSION:  1. Diffuse steatosis of the liver without evidence of focal  hepatic lesions.  2. Colonic thickening involving the distal half of the colon  suggestive of some type of underlying colitis. No evidence of  associated perforation, obstruction or abscess.    CMP     Component Value Date/Time   NA 140 07/05/2012 1430   K 4.5 07/05/2012 1430   CL 102 07/05/2012 1430   CO2 30 07/05/2012 1430   GLUCOSE 103* 07/05/2012 1430   BUN 14 07/05/2012 1430   CREATININE 0.74 07/05/2012 1430   CREATININE 0.74 06/16/2012 1115   CALCIUM 9.7 07/05/2012 1430   PROT 7.0 07/05/2012 1430   ALBUMIN 4.3 07/05/2012 1430   AST 54* 07/05/2012 1430   ALT 128* 07/05/2012 1430   ALKPHOS 106 07/05/2012 1430   BILITOT 0.5 07/05/2012 1430   GFRNONAA >90 06/16/2012 1115   GFRAA >90 06/16/2012 1115  06/16/2012 AST 95, ALT 92, ALP 121.  [place] 06/27/2012: CBC 6.4, Hepatic steatosis.  Review of Systems     Objective:   Physical  Exam Filed Vitals:   08/02/12 1456  Height: 5\' 2"  (1.575 m)  Weight: 166 lb 8 oz (75.524 kg)   Alert and oriented. Skin warm and dry. Oral mucosa is moist.   . Sclera anicteric, conjunctivae is pink. Thyroid not enlarged. No cervical lymphadenopathy. Lungs clear. Heart regular rate and rhythm.  Abdomen is soft. Bowel sounds are positive. No hepatomegaly. No abdominal masses felt. No tenderness.  No edema to lower extremities. Patient is alert and oriented.     Assessment:    Elevated transaminiases which are coming down.Hx of ischemic colitis.  Hx of CBD stone    Plan:     Will recheck cmet and total cholesterol at patient's request.  Further recommendations to follow.

## 2012-08-02 NOTE — Patient Instructions (Addendum)
Will repeat cmet today and total cholesterol.

## 2012-08-03 ENCOUNTER — Telehealth (INDEPENDENT_AMBULATORY_CARE_PROVIDER_SITE_OTHER): Payer: Self-pay | Admitting: Internal Medicine

## 2012-08-03 LAB — COMPREHENSIVE METABOLIC PANEL
ALT: 99 U/L — ABNORMAL HIGH (ref 0–35)
Alkaline Phosphatase: 112 U/L (ref 39–117)
Glucose, Bld: 86 mg/dL (ref 70–99)
Sodium: 140 mEq/L (ref 135–145)
Total Bilirubin: 0.4 mg/dL (ref 0.3–1.2)
Total Protein: 6.6 g/dL (ref 6.0–8.3)

## 2012-08-03 NOTE — Telephone Encounter (Signed)
Results given to patient.  No pain.

## 2012-08-11 ENCOUNTER — Ambulatory Visit (HOSPITAL_BASED_OUTPATIENT_CLINIC_OR_DEPARTMENT_OTHER): Payer: Medicare (Managed Care) | Admitting: Lab

## 2012-08-11 ENCOUNTER — Encounter: Payer: Self-pay | Admitting: Physician Assistant

## 2012-08-11 ENCOUNTER — Telehealth: Payer: Self-pay | Admitting: *Deleted

## 2012-08-11 ENCOUNTER — Ambulatory Visit (HOSPITAL_BASED_OUTPATIENT_CLINIC_OR_DEPARTMENT_OTHER): Payer: Medicare (Managed Care) | Admitting: Physician Assistant

## 2012-08-11 VITALS — BP 99/61 | HR 66 | Temp 98.4°F | Resp 20 | Ht 62.0 in | Wt 162.7 lb

## 2012-08-11 DIAGNOSIS — C50419 Malignant neoplasm of upper-outer quadrant of unspecified female breast: Secondary | ICD-10-CM

## 2012-08-11 DIAGNOSIS — Z170 Estrogen receptor positive status [ER+]: Secondary | ICD-10-CM

## 2012-08-11 LAB — COMPREHENSIVE METABOLIC PANEL (CC13)
CO2: 27 mEq/L (ref 22–29)
Creatinine: 0.9 mg/dL (ref 0.6–1.1)
Glucose: 89 mg/dl (ref 70–99)
Total Bilirubin: 0.5 mg/dL (ref 0.20–1.20)

## 2012-08-11 LAB — CBC WITH DIFFERENTIAL/PLATELET
BASO%: 0.7 % (ref 0.0–2.0)
Eosinophils Absolute: 0.1 10*3/uL (ref 0.0–0.5)
HCT: 43.9 % (ref 34.8–46.6)
LYMPH%: 22.1 % (ref 14.0–49.7)
MCHC: 33.8 g/dL (ref 31.5–36.0)
MONO#: 0.7 10*3/uL (ref 0.1–0.9)
NEUT%: 65.4 % (ref 38.4–76.8)
Platelets: 244 10*3/uL (ref 145–400)
WBC: 7.1 10*3/uL (ref 3.9–10.3)

## 2012-08-11 NOTE — Telephone Encounter (Signed)
Gave patient appointment for lab and midlevel and genetics counseling on 10-06-2012  Sent patient back to the lab

## 2012-08-11 NOTE — Progress Notes (Signed)
ID: Angela Boyer   DOB: 1961/08/19  MR#: [id number]  HYQ#:[id number]  HISTORY OF PRESENT ILLNESS: The patient (goes by "Angela Boyer") had bilateral screening mammography 0502/2013 showing microcalcifications in the right breast. Additional views were obtained of 04/06/2012 and this confirmed a 7 mm cluster of pleomorphic calcifications in the upper outer quadrant of the right breast. Biopsy of the mass was performed the same day, and showed ([id number]) ductal carcinoma in situ, high-grade, estrogen and progesterone receptor both positive at 100%.  The patient proceeded to bilateral breast MRI 04/12/2012 showing only post biopsy change, with no masses or abnormal enhancement suspicious for malignancy in either breast and no adenopathy. Her subsequent history is as detailed below.  INTERVAL HISTORY: The patient returns today for followup of her breast cancer. Interval history is remarkable for Angela Boyer having completed radiation therapy. She had some skin changes which have now resolved. Otherwise, she is feeling quite well today.   REVIEW OF SYSTEMS: Angela Boyer has had no recent illnesses and denies fevers or chills. She has had a history of hot flashes, but these have recently improved. She tried Effexor which she could not tolerate. She is on gabapentin twice daily for chronic pain, and this may also be helping with her hot flashes as well. She's had no skin changes and denies abnormal bleeding. No nausea, change in bowel habits, or change in urinary habits. She denies cough, phlegm production, or increased shortness of breath, and has had no chest pain. No abnormal headaches, dizziness, or change in vision.  A detailed review of systems is otherwise stable and noncontributory.  PAST MEDICAL HISTORY: Past Medical History  Diagnosis Date  . Colitis   . Ischemic colitis, enteritis, or enterocolitis   . Hypertension   . GERD (gastroesophageal reflux disease)   . Anxiety   . H/O colonoscopy 2012  . Night  sweats   . Wears glasses   . Depression   . Hot flashes   . Burning tongue syndrome   . Breast cancer 05/04/12    right breast lumpectomy=Ductal carcinoma in situw/comedo necrosis&calcifications,ER?PR=positivedx 04/06/12  . Breast cancer   . Breast cancer     RT diagnosed this year   chronic pain syndrome, the patient receiving all pain medicines through a pain clinic in [place].  PAST SURGICAL HISTORY: Past Surgical History  Procedure Date  . Diagnostic laparoscopy     ischemic colitis  . Colon surgery     partial colectomy during exploratory surg  . 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 Breast lumpectomy 05/04/12    right breast  . Breast biopsy 04/06/12    right breast bx 11 0'clock  . Abdominal hysterectomy 2009    ovaries and tubes intact  . Appendectomy     30 years ago  . Cholecystectomy 2012   she had a ruptured of central nervous system aneurysm approximately 12 years ago, which resulted on her being disabled. She did not have her ovaries removed at the time of her hysterectomy.  FAMILY HISTORY Family History  Problem Relation Age of Onset  . Breast cancer Mother   . Cancer Mother     breast  . Hypertension Mother   . Melanoma Father   . Cancer Father     melanoma  . Diabetes Father   . Hypertension Father   . Hypertension Brother    The patient's parents are alive. Her mother was diagnosed with breast cancer at the age of 14. She underwent  left mastectomy and is doing well currently age 29. The patient's father had what sounds like squamous cell cancers of the face secondary to sun exposure, clearly not melanoma. The patient has one brother, no sisters. There are no other cancers in the immediate family.  GYNECOLOGIC HISTORY: Menarche age 38, GX P72, with first live birth at age 37. She status post hysterectomy without salpingo-oophorectomy. She took hormone replacement for about 4 months, stopping April 2013.  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 Her husband Jasmina Gendron (goes by Clovis Riley) is a Chiropractor. Daughter Vinetta Bergamo lives in [place] and owns a Education officer, environmental business. Daughter Casi. Midkiff lives in [place] at and works at a group home for local children. Son Lindee Leason lives in [place] and workes for the department of [employer]. The patient has 2 grandchildren. She is not a church attender  ADVANCED DIRECTIVES: Not in place  HEALTH MAINTENANCE: History  Substance Use Topics  . Smoking status: Former Smoker -- .5 years    Types: Cigarettes    Quit date: 05/19/1992  . Smokeless tobacco: Never Used  . Alcohol Use: No     Colonoscopy: 2011/[facility]  PAP: s/p hysterectomy  Bone density: never  Lipid panel:  Allergies  Allergen Reactions  . Tape     Thin skin   . Vicodin (Hydrocodone-Acetaminophen) Nausea And Vomiting    Current Outpatient Prescriptions  Medication Sig Dispense Refill  . amLODipine (NORVASC) 10 MG tablet Take 10 mg by mouth daily.        . benazepril (LOTENSIN) 20 MG tablet Take 20 mg by mouth daily.        . clonazePAM (KLONOPIN) 0.5 MG tablet Take 0.5 mg by mouth daily.       Marland Kitchen gabapentin (NEURONTIN) 600 MG tablet Take 600 mg by mouth 2 (two) times daily.        Marland Kitchen LORazepam (ATIVAN) 0.5 MG tablet Take 0.5 mg by mouth as needed. Panic Attacks      . omeprazole (PRILOSEC) 40 MG capsule Take 40 mg by mouth daily.        Marland Kitchen oxyCODONE-acetaminophen (PERCOCET) 10-325 MG per tablet Take 1 tablet by mouth every 4 (four) hours as needed. Pain      . zolpidem (AMBIEN) 10 MG tablet Take 10 mg by mouth as needed. Sleep        OBJECTIVE: Middle-aged white woman  who appears comfortable and in no acute distress  Filed Vitals:   08/11/12 1122  BP: 99/61  Pulse: 66  Temp: 98.4 F (36.9 C)  Resp: 20     Body mass index is 29.76 kg/(m^2).    ECOG FS:0 Filed Weights   08/11/12 1122  Weight: 162 lb 11.2 oz  (73.8 kg)   Sclerae unicteric Oropharynx clear No peripheral adenopathy Lungs no rales or rhonchi Heart regular rate and rhythm Abd soft, nontender; positive bowel sounds MSK no focal spinal tenderness, no peripheral edema Neuro: nonfocal, alert and oriented x3 Breasts: The right breast is status post lumpectomy. Mild skin changes secondary to radiation therapy. No suspicious nodularity. No evidence of local recurrence. Axillae are benign bilaterally with no adenopathy    LAB RESULTS: Lab Results  Component Value Date   WBC 7.1 08/11/2012   NEUTROABS 4.6 08/11/2012   HGB 14.8 08/11/2012   HCT 43.9 08/11/2012   MCV 95.3 08/11/2012   PLT 244 08/11/2012  Chemistry      Component Value Date/Time   NA 140 08/02/2012 1535   K 4.0 08/02/2012 1535   CL 104 08/02/2012 1535   CO2 28 08/02/2012 1535   BUN 15 08/02/2012 1535   CREATININE 0.69 08/02/2012 1535   CREATININE 0.74 06/16/2012 1115      Component Value Date/Time   CALCIUM 9.1 08/02/2012 1535   ALKPHOS 112 08/02/2012 1535   AST 79* 08/02/2012 1535   ALT 99* 08/02/2012 1535   BILITOT 0.4 08/02/2012 1535       Lab Results  Component Value Date   LABCA2 20 05/02/2012    STUDIES:  No results found.   ASSESSMENT: 51 y.o.  [place] woman   (1)  status post right lumpectomy and sentinel node biopsy  05/04/2012 for a ductal carcinoma in situ, grade 3, strongly estrogen and progesterone receptor positive, measuring 1.5 cm, with negative and ample margins  (2)  Status post radiation therapy, completed in late August 2013  (3)  Beginning on tamoxifen in September 2013  PLAN:  Angela Boyer has recovered well from radiation, and is now ready to begin her antiestrogen therapy. I have reviewed this case with Dr. Darnelle Catalan.  I spent over half of our 40 minute appointment today counseling the patient about the difference between aromatase inhibitors and tamoxifen, and discussing the possible side effects and toxicities associated with these  medications. She is status post hysterectomy, but still has her ovaries intact. We discussed going ahead and starting on tamoxifen, but we'll check her hormone levels today as a baseline.  I will see Angela Boyer back in 8 weeks to assess her tolerance of the tamoxifen. She would also like to see our genetic counselor regarding possible genetic testing. She knows to call prior to his appointments with any changes or problems.   Zollie Scale    08/11/2012

## 2012-08-12 LAB — CANCER ANTIGEN 27.29: CA 27.29: 25 U/mL (ref 0–39)

## 2012-08-12 LAB — LUTEINIZING HORMONE: LH: 38.4 m[IU]/mL

## 2012-08-12 LAB — FOLLICLE STIMULATING HORMONE: FSH: 62.2 m[IU]/mL

## 2012-08-18 ENCOUNTER — Other Ambulatory Visit: Payer: Self-pay | Admitting: *Deleted

## 2012-08-18 MED ORDER — TAMOXIFEN CITRATE 20 MG PO TABS: 20.0000 mg | ORAL_TABLET | Freq: Every day | ORAL | Status: DC

## 2012-08-19 LAB — ESTRADIOL, ULTRA SENS: Estradiol, Ultra Sensitive: 9 pg/mL

## 2012-09-06 ENCOUNTER — Encounter: Payer: Self-pay | Admitting: *Deleted

## 2012-09-06 ENCOUNTER — Telehealth: Payer: Self-pay | Admitting: *Deleted

## 2012-09-06 NOTE — Telephone Encounter (Signed)
Pt called to this RN to report medical situation occurring post start of Tamoxifen. Per Angela Boyer she was seen by AB/PA in Sept but did not start the Tamoxifen until several weeks later " I just needed to prepare myself to take it "-  Post 4 days on medication she developed severe SOB with pain in her neck -  She was with her daughter at the time and 911 was called with pt going to [facility] in [facility] ( pt has a home in area as well and a primary MD locally ).  Per hospital stay ( 23 obs ) cardiac event was ruled out and pt requested to be discharged.  She has not resumed the Tamoxifen and has no further recurrence of symptoms. Angela Boyer states " and I will not take it again but now I don't know what I am going to do because my cancer was 100% for estrogen ".  Note pt was very emotional during discussion often stopping in mid sentence with crying. Per inquiry by this RN pt feels overwhelmed by life events but denied feeling hopeless or suicidial.  Angela Boyer also states she is having " hot flashes all day and is keeping the air on 65 .Marland Kitchen Just feel terrible" She states she was given a prescription by MD for effexor for hot flashes " but I haven't gotten it filled because I am on this clonipin and I don't want to mix the drugs "  This RN explained what effexor is and how it can help her and that medically is ok to use with clonipin with pt then stating " well I am just on too many medications as is ".  Pt also inquired per call about use of plant based bio identical estrogens which she has heard such good outcomes with use.  This RN explained above may be true but medically can not advise use due to not enough studies to prove if not harmful relating to her ER+ breast ca hx.  Angela Boyer also states she has noticed some discomfort in right arm where " 3 lymph nodes were removed ".  She states she felt it when she was putting on a jacket and pulled her arm back. She does not have any  noted swelling.  Plan per call is pt will not resume Tamoxifen. She is scheduled for follow up visit 10/06/2012. Referral to the lymphedema clinic will be placed. This note will be given to MD for review and any further recommendations or appointment changes.

## 2012-09-08 ENCOUNTER — Telehealth: Payer: Self-pay | Admitting: *Deleted

## 2012-09-08 ENCOUNTER — Other Ambulatory Visit: Payer: Self-pay | Admitting: *Deleted

## 2012-09-08 DIAGNOSIS — C50919 Malignant neoplasm of unspecified site of unspecified female breast: Secondary | ICD-10-CM

## 2012-09-08 NOTE — Telephone Encounter (Signed)
Called [phone] to inform them of the referral in the epic system on the patient  Also faxed over referral to [phone] to also them a hard copy of the referral

## 2012-09-14 ENCOUNTER — Telehealth: Payer: Self-pay | Admitting: Emergency Medicine

## 2012-09-14 NOTE — Telephone Encounter (Signed)
Received call from patient about a release form for the rehab program.  Patient is scheduled for an eval at the lymphedema clinic tomorrow 10/24; per the lymphedema clinic patient is ok and no other forms needed.  Patient very tearful at times while talking.  I left a message with Leotis Shames the social worker to contact the patient.   I suggested that the patient speak with a Child psychotherapist.   She agreed with the plan.

## 2012-09-15 ENCOUNTER — Ambulatory Visit: Payer: Medicare (Managed Care) | Attending: Oncology | Admitting: Physical Therapy

## 2012-09-15 DIAGNOSIS — M24519 Contracture, unspecified shoulder: Secondary | ICD-10-CM | POA: Insufficient documentation

## 2012-09-15 DIAGNOSIS — IMO0001 Reserved for inherently not codable concepts without codable children: Secondary | ICD-10-CM | POA: Insufficient documentation

## 2012-09-15 DIAGNOSIS — M79609 Pain in unspecified limb: Secondary | ICD-10-CM | POA: Insufficient documentation

## 2012-09-15 DIAGNOSIS — I890 Lymphedema, not elsewhere classified: Secondary | ICD-10-CM | POA: Insufficient documentation

## 2012-09-19 ENCOUNTER — Telehealth: Payer: Self-pay | Admitting: *Deleted

## 2012-09-19 NOTE — Telephone Encounter (Signed)
Pt called to inform me that she will be seeing Dr. Derrell Lolling tomorrow 10/21/12 at 5:15 for c/o right underarm pain and breast pain.  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  She was helping her vacuum and dust.  Confirmed f/u appt on 10/06/12.  Pt denies further needs at this time.  Encourage pt to call with further questions or concerns.  Gave emotional support.  Pt will call back on 09/22/12 to give update on status.

## 2012-09-20 ENCOUNTER — Ambulatory Visit (INDEPENDENT_AMBULATORY_CARE_PROVIDER_SITE_OTHER): Payer: Medicare (Managed Care) | Admitting: General Surgery

## 2012-09-20 ENCOUNTER — Encounter (INDEPENDENT_AMBULATORY_CARE_PROVIDER_SITE_OTHER): Payer: Self-pay | Admitting: General Surgery

## 2012-09-20 ENCOUNTER — Telehealth (INDEPENDENT_AMBULATORY_CARE_PROVIDER_SITE_OTHER): Payer: Self-pay | Admitting: General Surgery

## 2012-09-20 VITALS — BP 104/62 | HR 96 | Temp 98.0°F | Resp 20 | Ht 63.0 in | Wt 162.6 lb

## 2012-09-20 DIAGNOSIS — C50419 Malignant neoplasm of upper-outer quadrant of unspecified female breast: Secondary | ICD-10-CM

## 2012-09-20 NOTE — Progress Notes (Signed)
Patient ID: Angela Boyer, female   DOB: [birth year], 51 y.o.   MRN: [id number]  Chief Complaint  Patient presents with  . Pain    pain in Rt br and arm    HPI Angela Boyer is a 51 y.o. female.  She returns for followup regarding her right breast cancer.  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 Pathologic stage Tis, N0.  She had a very inflamed area of skin in the right breast last time I saw her. I put her on silvadene and she says this settled down within a few days and she feels fine.  She started on tamoxifen but then had an area an episode of shortness of breath and hypotension and was admitted to a hospital in [facility]. . She has stopped tamoxifen and has discussed that with Timoteo Expose at the cancer center. She is scheduled to see Dr. Darnelle Catalan next month. She feels fine now. HPI  Past Medical History  Diagnosis Date  . Colitis   . Ischemic colitis, enteritis, or enterocolitis   . Hypertension   . GERD (gastroesophageal reflux disease)   . Anxiety   . H/O colonoscopy 2012  . Night sweats   . Wears glasses   . Depression   . Hot flashes   . Burning tongue syndrome   . Breast cancer 05/04/12    right breast lumpectomy=Ductal carcinoma in situw/comedo necrosis&calcifications,ER?PR=positivedx 04/06/12  . Breast cancer   . Breast cancer     RT diagnosed this year    Past Surgical History  Procedure Date  . Diagnostic laparoscopy     ischemic colitis  . Colon surgery     partial colectomy during exploratory surg  . 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 Breast lumpectomy 05/04/12    right breast  . Breast biopsy 04/06/12    right breast bx 11 0'clock  . Abdominal hysterectomy 2009    ovaries and tubes intact  . Appendectomy     30 years ago  . Cholecystectomy 2012    Family History  Problem Relation Age of Onset  . Breast cancer  Mother   . Cancer Mother     breast  . Hypertension Mother   . Melanoma Father   . Cancer Father     melanoma  . Diabetes Father   . Hypertension Father   . Hypertension Brother     Social History History  Substance Use Topics  . Smoking status: Former Smoker -- .5 years    Types: Cigarettes    Quit date: 05/19/1992  . Smokeless tobacco: Never Used  . Alcohol Use: No    Allergies  Allergen Reactions  . Tape     Thin skin   . Vicodin (Hydrocodone-Acetaminophen) Nausea And Vomiting    Current Outpatient Prescriptions  Medication Sig Dispense Refill  . amLODipine (NORVASC) 10 MG tablet Take 10 mg by mouth daily.        . benazepril (LOTENSIN) 20 MG tablet Take 20 mg by mouth daily.        . clonazePAM (KLONOPIN) 0.5 MG tablet Take 0.5 mg by mouth daily.       Marland Kitchen gabapentin (NEURONTIN) 600 MG tablet Take 600 mg by mouth 2 (two) times daily.        Marland Kitchen LORazepam (ATIVAN) 0.5 MG tablet Take 0.5 mg by mouth as needed. Panic  Attacks      . omeprazole (PRILOSEC) 40 MG capsule Take 40 mg by mouth daily.        Marland Kitchen oxyCODONE-acetaminophen (PERCOCET) 10-325 MG per tablet Take 1 tablet by mouth every 4 (four) hours as needed. Pain      . tamoxifen (NOLVADEX) 20 MG tablet Take 1 tablet (20 mg total) by mouth daily.  30 tablet  12  . zolpidem (AMBIEN) 10 MG tablet Take 10 mg by mouth as needed. Sleep        Review of Systems Review of Systems  Constitutional: Negative for fever, chills and unexpected weight change.  HENT: Negative for hearing loss, congestion, sore throat, trouble swallowing and voice change.   Eyes: Negative for visual disturbance.  Respiratory: Negative for cough and wheezing.   Cardiovascular: Negative for chest pain, palpitations and leg swelling.  Gastrointestinal: Negative for nausea, vomiting, abdominal pain, diarrhea, constipation, blood in stool, abdominal distention and anal bleeding.  Genitourinary: Negative for hematuria, vaginal bleeding and difficulty  urinating.  Musculoskeletal: Positive for myalgias and arthralgias.  Skin: Negative for rash and wound.  Neurological: Negative for seizures, syncope and headaches.  Hematological: Negative for adenopathy. Does not bruise/bleed easily.  Psychiatric/Behavioral: Negative for confusion. The patient is nervous/anxious.     Blood pressure 104/62, pulse 96, temperature 98 F (36.7 C), temperature source Temporal, resp. rate 20, height 5\' 3"  (1.6 m), weight 162 lb 9.6 oz (73.755 kg).  Physical Exam Physical Exam  Constitutional: She is oriented to person, place, and time. She appears well-developed and well-nourished. No distress.  Eyes: Conjunctivae normal and EOM are normal. Pupils are equal, round, and reactive to light. Left eye exhibits no discharge. No scleral icterus.  Neck: Neck supple. No JVD present. No tracheal deviation present. No thyromegaly present.  Cardiovascular: Normal rate, regular rhythm, normal heart sounds and intact distal pulses.   No murmur heard. Pulmonary/Chest: Effort normal and breath sounds normal. No respiratory distress. She has no wheezes. She has no rales. She exhibits no tenderness.       Incision right breast upper outer quadrant and right axilla had healed well.   The breast skin now looks normal. There is no palpable mass in the breast. No fluid collection. A little  tenderness everywhere. No axillary mass. Right arm feels normal with good range of motion, no swelling.  Musculoskeletal: She exhibits no edema and no tenderness.  Lymphadenopathy:    She has no cervical adenopathy.  Neurological: She is alert and oriented to person, place, and time.  Skin: Skin is warm. No rash noted. She is not diaphoretic. No erythema. No pallor.  Psychiatric: She has a normal mood and affect. Her behavior is normal. Judgment and thought content normal.    Data Reviewed   Assessment    DCIS right breast, upper outer quadrant, or center positive. Doing well 4 months  following right partial mastectomy sentinel biopsy and radiation therapy  Reported intolerance to tamoxifen  Anxiety  Chronic pain issues, followed by pain clinic in [facility].    Plan    Keep appointment with Dr. Darnelle Catalan next month to discuss other options for antiestrogen therapy.  Repeat bilateral mammograms in May 2014  Return to see me in June 2014       Rayshaun Needle M. Derrell Lolling, M.D., [facility] General and Minimally invasive Surgery Breast and Colorectal Surgery Office:   [phone] Pager:   [phone]  09/20/2012, 4:34 PM

## 2012-09-20 NOTE — Telephone Encounter (Signed)
Called patient and offered for her to come in earlier to be seen. Advised the 4:45 appointment cancelled and there was an opening. Patient agreed. Will keep her appointment in system at 5:15 just in case she does run behind and gets here for the 5:15 originally set.

## 2012-09-20 NOTE — Patient Instructions (Signed)
The red area and inflammation of the right breast has healed very nicely. Your breast exam is normal and there is no sign of cancer.  Be sure to keep your appointment with Dr. Darnelle Catalan to decide about antiestrogen therapy.  Repeat bilateral mammograms at the breast center of [facility] in May 2014.  Return to see Dr. Derrell Lolling in June 2014.

## 2012-09-26 ENCOUNTER — Ambulatory Visit: Payer: Medicare (Managed Care) | Admitting: Physical Therapy

## 2012-10-04 ENCOUNTER — Telehealth: Payer: Self-pay | Admitting: *Deleted

## 2012-10-04 NOTE — Telephone Encounter (Signed)
patient called in out of town can not make the appointment patient was upset because the first open appointment with dr.magrinat is the first of December patient does not want to see the midlevel at all patient stated she would call Timoteo Expose because I did not want to wait till December to see dr.magrinat

## 2012-10-06 ENCOUNTER — Encounter: Payer: Medicare (Managed Care) | Admitting: Genetic Counselor

## 2012-10-06 ENCOUNTER — Other Ambulatory Visit: Payer: Medicare (Managed Care) | Admitting: Lab

## 2012-10-06 ENCOUNTER — Ambulatory Visit: Payer: Medicare (Managed Care) | Admitting: Physician Assistant

## 2012-10-07 ENCOUNTER — Telehealth: Payer: Self-pay | Admitting: *Deleted

## 2012-10-07 NOTE — Telephone Encounter (Signed)
Pt called to request a switch from Dr. Darnelle Catalan to Dr. Welton Flakes.

## 2012-10-09 ENCOUNTER — Other Ambulatory Visit: Payer: Self-pay | Admitting: Oncology

## 2012-10-14 ENCOUNTER — Encounter: Payer: Self-pay | Admitting: *Deleted

## 2012-10-14 DIAGNOSIS — C50419 Malignant neoplasm of upper-outer quadrant of unspecified female breast: Secondary | ICD-10-CM

## 2012-10-17 ENCOUNTER — Other Ambulatory Visit (HOSPITAL_BASED_OUTPATIENT_CLINIC_OR_DEPARTMENT_OTHER): Payer: Medicare (Managed Care) | Admitting: Lab

## 2012-10-17 ENCOUNTER — Telehealth: Payer: Self-pay | Admitting: *Deleted

## 2012-10-17 ENCOUNTER — Encounter: Payer: Self-pay | Admitting: Oncology

## 2012-10-17 ENCOUNTER — Ambulatory Visit (HOSPITAL_BASED_OUTPATIENT_CLINIC_OR_DEPARTMENT_OTHER): Payer: Medicare (Managed Care) | Admitting: Oncology

## 2012-10-17 VITALS — BP 124/80 | HR 74 | Temp 97.5°F | Resp 20 | Ht 63.0 in | Wt 166.8 lb

## 2012-10-17 DIAGNOSIS — C50419 Malignant neoplasm of upper-outer quadrant of unspecified female breast: Secondary | ICD-10-CM

## 2012-10-17 DIAGNOSIS — Z170 Estrogen receptor positive status [ER+]: Secondary | ICD-10-CM

## 2012-10-17 LAB — COMPREHENSIVE METABOLIC PANEL (CC13)
ALT: 60 U/L — ABNORMAL HIGH (ref 0–55)
AST: 33 U/L (ref 5–34)
Albumin: 3.8 g/dL (ref 3.5–5.0)
Alkaline Phosphatase: 113 U/L (ref 40–150)
Calcium: 9.7 mg/dL (ref 8.4–10.4)
Chloride: 104 mEq/L (ref 98–107)
Creatinine: 0.8 mg/dL (ref 0.6–1.1)
Potassium: 4.5 mEq/L (ref 3.5–5.1)

## 2012-10-17 LAB — CBC WITH DIFFERENTIAL/PLATELET
BASO%: 0.3 % (ref 0.0–2.0)
MCHC: 33.6 g/dL (ref 31.5–36.0)
MONO#: 0.7 10*3/uL (ref 0.1–0.9)
RBC: 4.73 10*6/uL (ref 3.70–5.45)
WBC: 6.3 10*3/uL (ref 3.9–10.3)
lymph#: 1.8 10*3/uL (ref 0.9–3.3)
nRBC: 0 % (ref 0–0)

## 2012-10-17 MED ORDER — TAMOXIFEN CITRATE 10 MG PO TABS: 10.0000 mg | ORAL_TABLET | Freq: Two times a day (BID) | ORAL | Status: DC

## 2012-10-17 NOTE — Telephone Encounter (Signed)
12-19-2012 gave patient appointment for 12-19-2012 starting at 8:30am

## 2012-10-17 NOTE — Progress Notes (Signed)
ID: [name]   DOB: 23-Sep-1961  MR#: [id number]  ZHY#:[id number]  HISTORY OF PRESENT ILLNESS: The patient (goes by "[name]") had bilateral screening mammography 0502/2013 showing microcalcifications in the right breast. Additional views were obtained of 04/06/2012 and this confirmed a 7 mm cluster of pleomorphic calcifications in the upper outer quadrant of the right breast. Biopsy of the mass was performed the same day, and showed ([id number]) ductal carcinoma in situ, high-grade, estrogen and progesterone receptor both positive at 100%.  The patient proceeded to bilateral breast MRI 04/12/2012 showing only post biopsy change, with no masses or abnormal enhancement suspicious for malignancy in either breast and no adenopathy. Her subsequent history is as detailed below.  INTERVAL HISTORY: Patient is a 51 year old female who was originally seen by Dr. Susy Frizzle are not and his PA. Patient however has decided to switch physicians. Clinically patient seems to be doing well and is without any significant complaints. However she does have significant anxiety. She tells me that she was prescribed tamoxifen but after taking 5 days of tamoxifen she was admitted to the hospital. With really sounds like it is a panic attack rather than a side effect from her tamoxifen. Patient apparently lives both in [place] as well as in [place]. She has not been taking tamoxifen at all. She denies any nausea vomiting fevers. Patient is complaining of having significant hot flashes. She tells me that she sweats all the time at nighttime she is unable to sleep a good enough sleep. She is fatigued all the time. She denies any fevers no vomiting no nausea no abdominal pain no diarrhea or constipation. A detailed review of systems is otherwise stable and noncontributory.  PAST MEDICAL HISTORY: Past Medical History  Diagnosis Date  . Colitis   . Ischemic colitis, enteritis, or enterocolitis   . Hypertension   .  GERD (gastroesophageal reflux disease)   . Anxiety   . H/O colonoscopy 2012  . Night sweats   . Wears glasses   . Depression   . Hot flashes   . Burning tongue syndrome   . Breast cancer 05/04/12    right breast lumpectomy=Ductal carcinoma in situw/comedo necrosis&calcifications,ER?PR=positivedx 04/06/12  . Breast cancer   . Breast cancer     RT diagnosed this year   chronic pain syndrome, the patient receiving all pain medicines through a pain clinic in [place].  PAST SURGICAL HISTORY: Past Surgical History  Procedure Date  . Diagnostic laparoscopy     ischemic colitis  . Colon surgery     partial colectomy during exploratory surg  . 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 Breast lumpectomy 05/04/12    right breast  . Breast biopsy 04/06/12    right breast bx 11 0'clock  . Abdominal hysterectomy 2009    ovaries and tubes intact  . Appendectomy     30 years ago  . Cholecystectomy 2012   she had a ruptured of central nervous system aneurysm approximately 12 years ago, which resulted on her being disabled. She did not have her ovaries removed at the time of her hysterectomy.  FAMILY HISTORY Family History  Problem Relation Age of Onset  . Breast cancer Mother   . Cancer Mother     breast  . Hypertension Mother   . Melanoma Father   . Cancer Father     melanoma  . Diabetes Father   . Hypertension Father   . Hypertension Brother    The  patient's parents are alive. Her mother was diagnosed with breast cancer at the age of 4. She underwent left mastectomy and is doing well currently age 90. The patient's father had what sounds like squamous cell cancers of the face secondary to sun exposure, clearly not melanoma. The patient has one brother, no sisters. There are no other cancers in the immediate family.  GYNECOLOGIC HISTORY: Menarche age 77, GX P19, with first live birth at age 89. She status post hysterectomy without salpingo-oophorectomy. She took hormone  replacement for about 4 months, stopping April 2013.  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 Her husband Paulita Licklider (goes by Clovis Riley) is a Chiropractor. Daughter Vinetta Bergamo lives in [place] and owns a Education officer, environmental business. Daughter Casi. Rossi lives in [place] and works at a group home for local children. Son Noralyn Karim lives in [place] and workes for the department of [employer]. The patient has 2 grandchildren. She is not a church attender  ADVANCED DIRECTIVES: Not in place  HEALTH MAINTENANCE: History  Substance Use Topics  . Smoking status: Former Smoker -- .5 years    Types: Cigarettes    Quit date: 05/19/1992  . Smokeless tobacco: Never Used  . Alcohol Use: No     Colonoscopy: 2011/[facility]  PAP: s/p hysterectomy  Bone density: never  Lipid panel:  Allergies  Allergen Reactions  . Tape     Thin skin   . Vicodin (Hydrocodone-Acetaminophen) Nausea And Vomiting    Current Outpatient Prescriptions  Medication Sig Dispense Refill  . amLODipine (NORVASC) 10 MG tablet Take 10 mg by mouth daily.        . benazepril (LOTENSIN) 20 MG tablet Take 20 mg by mouth daily.        . clonazePAM (KLONOPIN) 0.5 MG tablet Take 0.5 mg by mouth daily.       Marland Kitchen gabapentin (NEURONTIN) 600 MG tablet Take 600 mg by mouth 2 (two) times daily.        Marland Kitchen omeprazole (PRILOSEC) 40 MG capsule Take 40 mg by mouth daily.        Marland Kitchen oxyCODONE-acetaminophen (PERCOCET) 10-325 MG per tablet Take 1 tablet by mouth every 4 (four) hours as needed. Pain      . zolpidem (AMBIEN) 10 MG tablet Take 10 mg by mouth as needed. Sleep      . LORazepam (ATIVAN) 0.5 MG tablet Take 0.5 mg by mouth as needed. Panic Attacks        OBJECTIVE: Middle-aged white woman  who appears comfortable and in no acute distress  Filed Vitals:   10/17/12 1134  BP: 124/80  Pulse: 74  Temp: 97.5 F (36.4 C)  Resp: 20     Body mass index is  29.55 kg/(m^2).    ECOG FS:0 Filed Weights   10/17/12 1134  Weight: 166 lb 12.8 oz (75.66 kg)   Sclerae unicteric Oropharynx clear No peripheral adenopathy Lungs no rales or rhonchi Heart regular rate and rhythm Abd soft, nontender; positive bowel sounds MSK no focal spinal tenderness, no peripheral edema Neuro: nonfocal, alert and oriented x3 Breasts: The right breast is status post lumpectomy. Mild skin changes secondary to radiation therapy. No suspicious nodularity. No evidence of local recurrence. Axillae are benign bilaterally with no adenopathy    LAB RESULTS: Lab Results  Component Value Date   WBC 6.3 10/17/2012   NEUTROABS 3.7 10/17/2012   HGB 14.9 10/17/2012  HCT 44.4 10/17/2012   MCV 93.9 10/17/2012   PLT 238 10/17/2012      Chemistry      Component Value Date/Time   NA 141 10/17/2012 1102   NA 140 08/02/2012 1535   K 4.5 10/17/2012 1102   K 4.0 08/02/2012 1535   CL 104 10/17/2012 1102   CL 104 08/02/2012 1535   CO2 30* 10/17/2012 1102   CO2 28 08/02/2012 1535   BUN 14.0 10/17/2012 1102   BUN 15 08/02/2012 1535   CREATININE 0.8 10/17/2012 1102   CREATININE 0.69 08/02/2012 1535   CREATININE 0.74 06/16/2012 1115      Component Value Date/Time   CALCIUM 9.7 10/17/2012 1102   CALCIUM 9.1 08/02/2012 1535   ALKPHOS 113 10/17/2012 1102   ALKPHOS 112 08/02/2012 1535   AST 33 10/17/2012 1102   AST 79* 08/02/2012 1535   ALT 60* 10/17/2012 1102   ALT 99* 08/02/2012 1535   BILITOT 0.64 10/17/2012 1102   BILITOT 0.4 08/02/2012 1535       Lab Results  Component Value Date   LABCA2 25 08/11/2012    STUDIES:  No results found.   ASSESSMENT: 51 y.o.  [place] woman   (1)  status post right lumpectomy and sentinel node biopsy  05/04/2012 for a ductal carcinoma in situ, grade 3, strongly estrogen and progesterone receptor positive, measuring 1.5 cm, with negative and ample margins  (2)  Status post radiation therapy, completed in late August 2013  (3)   patient was begun on tamoxifen but she discontinued it on her own. Her original dose was 20 mg on a daily basis. But she has not been taking it for quite some time. She and I today discussed the rationale for tamoxifen. She understands. We opted to start her at 10 mg on a daily basis.  #4 patient also was counseled about her anxiety. We did discuss hot flashes. I think she may benefit from starting Effexor. I gave her a prescription for it today.  PLAN:   #1 patient will proceed with tamoxifen 10 mg on a daily basis risks and benefits of this were discussed. If she tolerates the 10 mg then certainly we could try to increase her dose to 20 mg.  #2 we also started her on Effexor XR at 3 7.5 mg on a daily basis and if she tolerates this we can increase the dose.  #3 I will plan on seeing her back in 3-6 months time in followup  Drue Second, MD Medical/Oncology [facility] [phone] (beeper) [phone] (Office)

## 2012-10-21 ENCOUNTER — Telehealth: Payer: Self-pay | Admitting: Emergency Medicine

## 2012-10-21 NOTE — Telephone Encounter (Signed)
Patient called with concerns about taking the Effexor.   States she took her first dose last evening and states she woke up several times during the night and then slept today until 12:00 noon.  Patient states she is not going to continue taking the Effexor.  Patient advised to return for all future appointments and to call for any other concerns.

## 2012-11-10 IMAGING — US US ABDOMEN LIMITED
1 series · 14 of 25 positions shown · non-contrast
Comparison: CT scan dated 06/16/2012

CLINICAL DATA: Elevated transaminases.  Previous cholecystectomy.

LIMITED ABDOMINAL ULTRASOUND - RIGHT UPPER QUADRANT

[Series 1: us abdomen limited · 0.25mm/px · 14 of 47 slices shown]
[im 1/47]
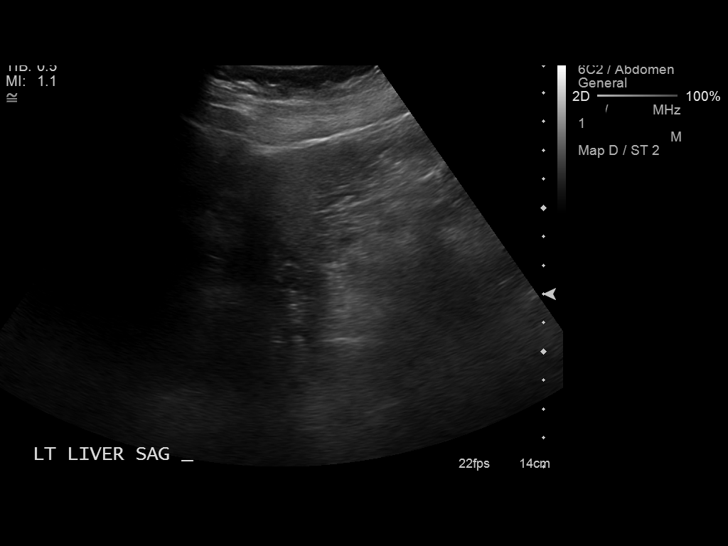
[im 4/47]
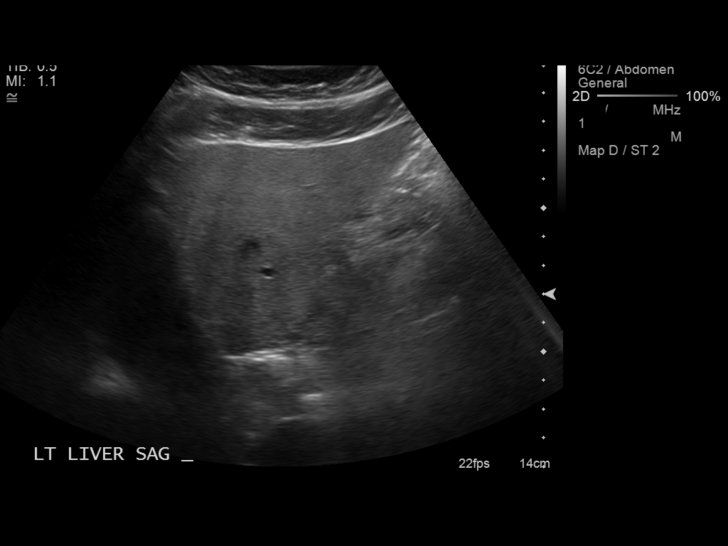
[im 8/47]
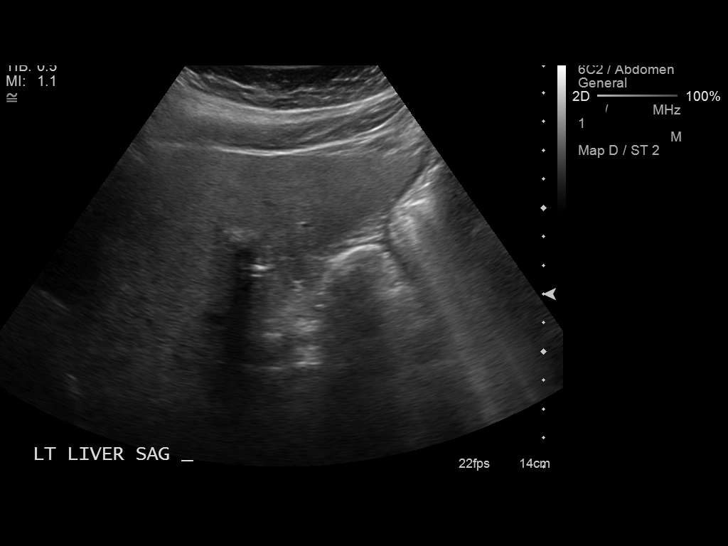
[im 12/47]
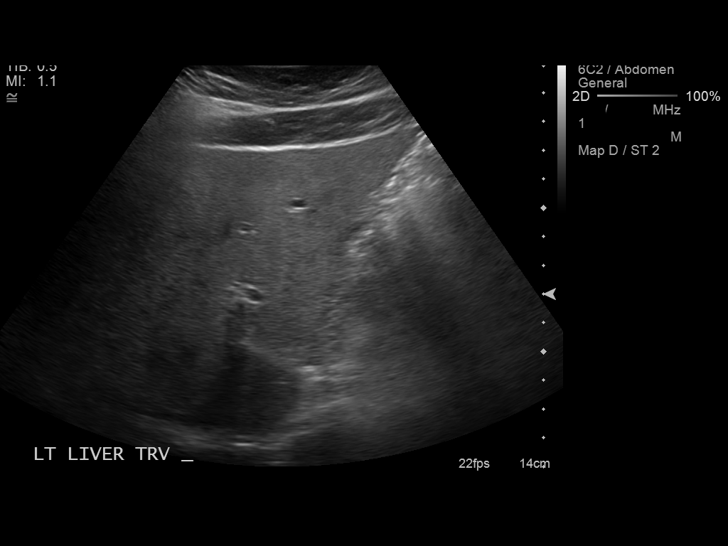
[im 16/47]
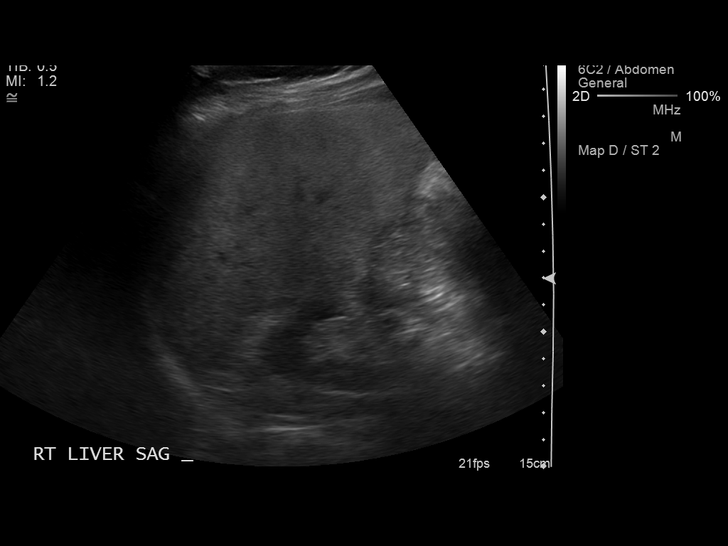
[im 18/47]
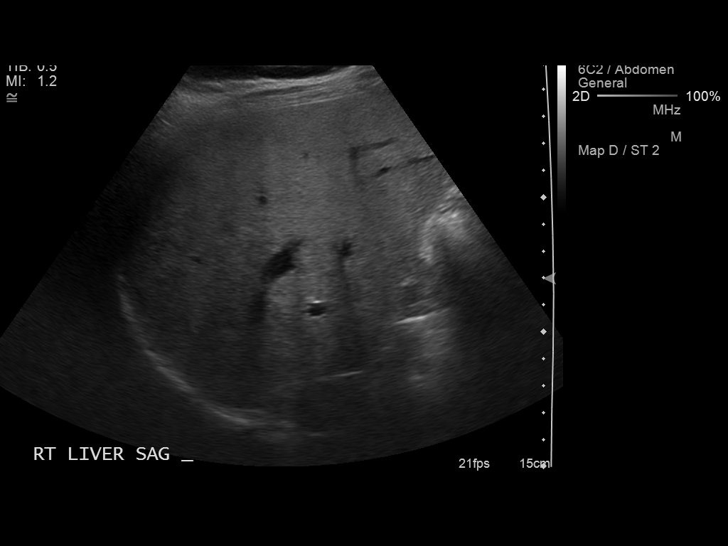
[im 22/47]
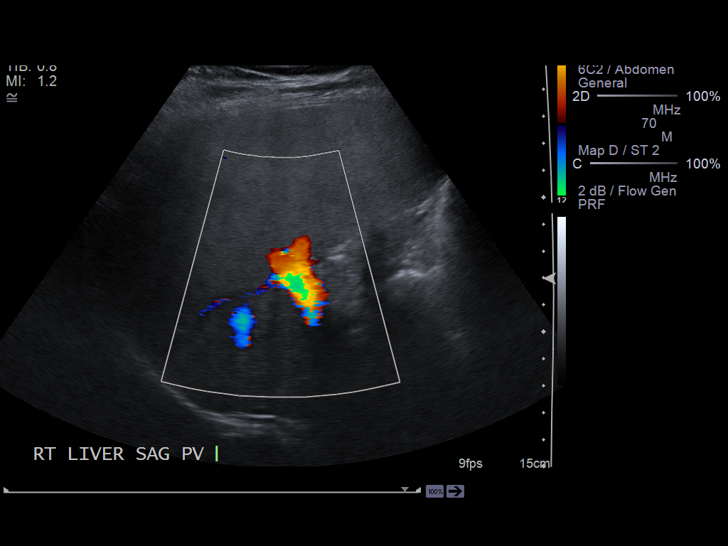
[im 25/47]
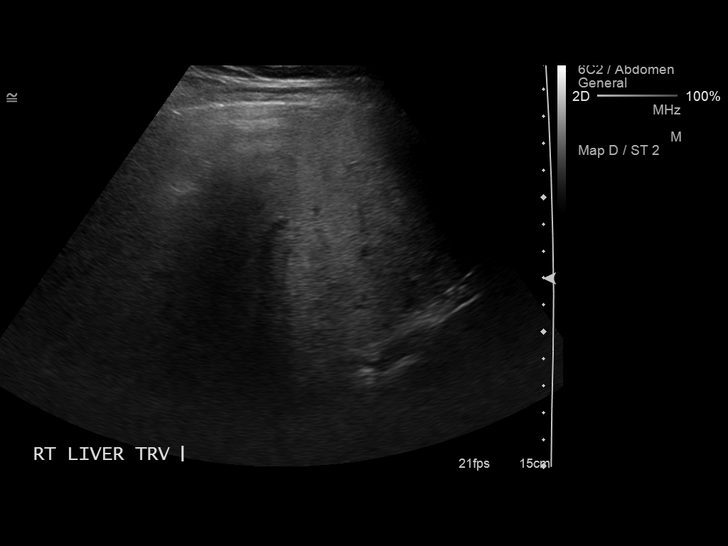
[im 29/47]
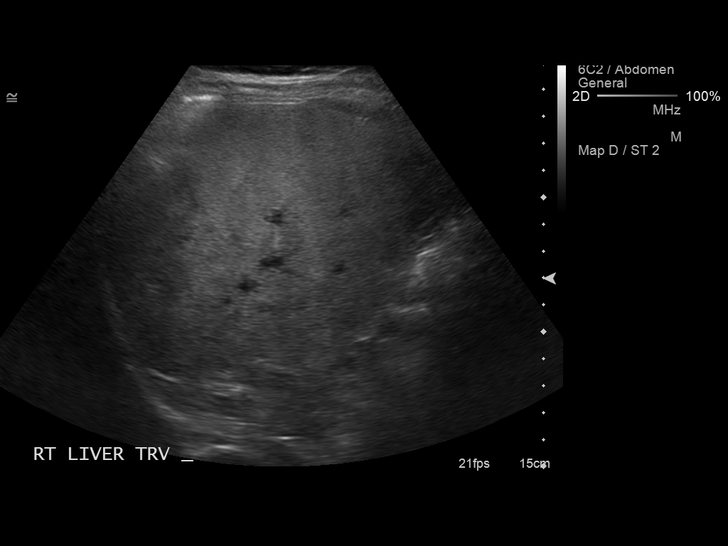
[im 31/47]
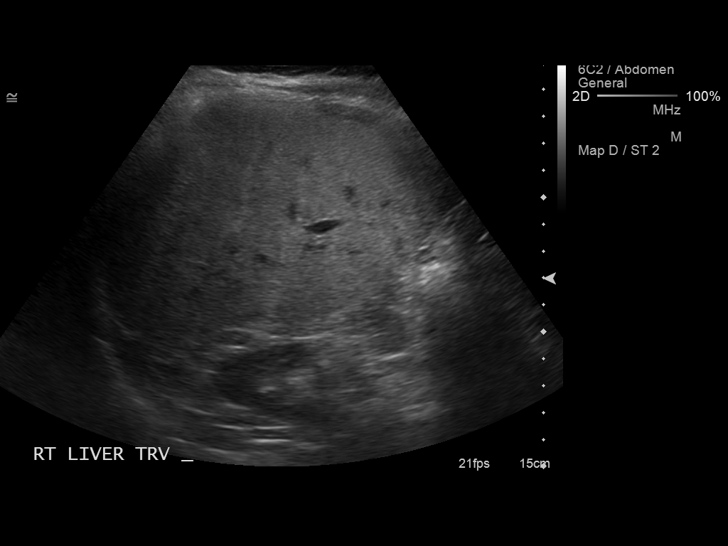
[im 35/47]
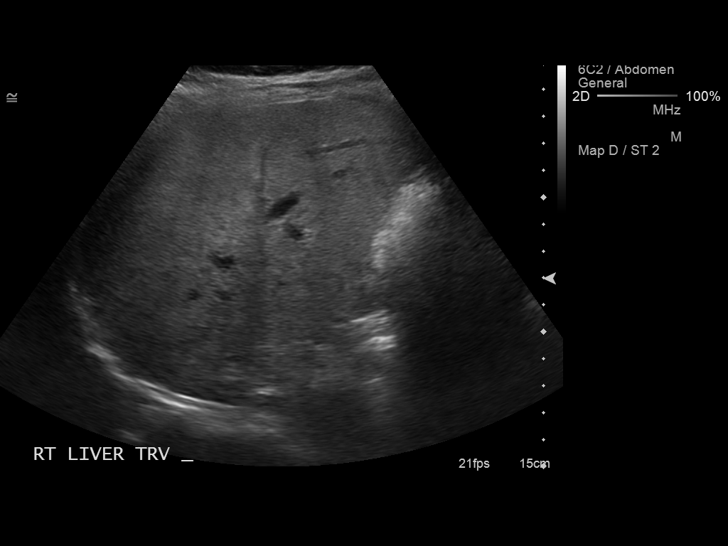
[im 39/47]
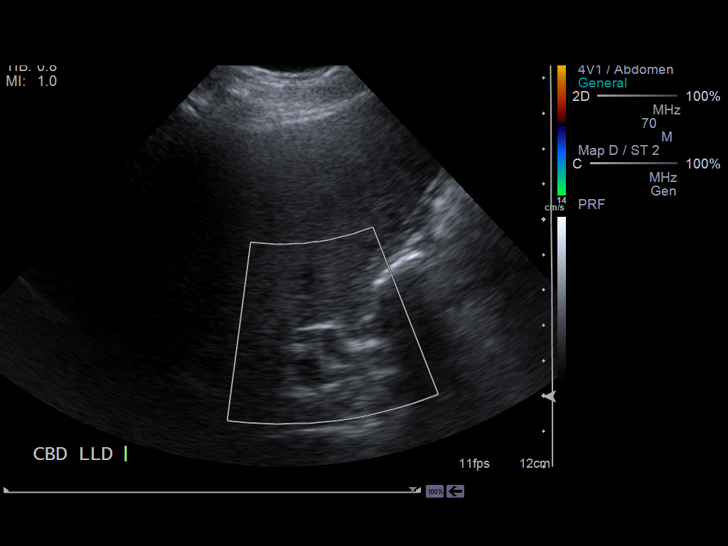
[im 43/47]
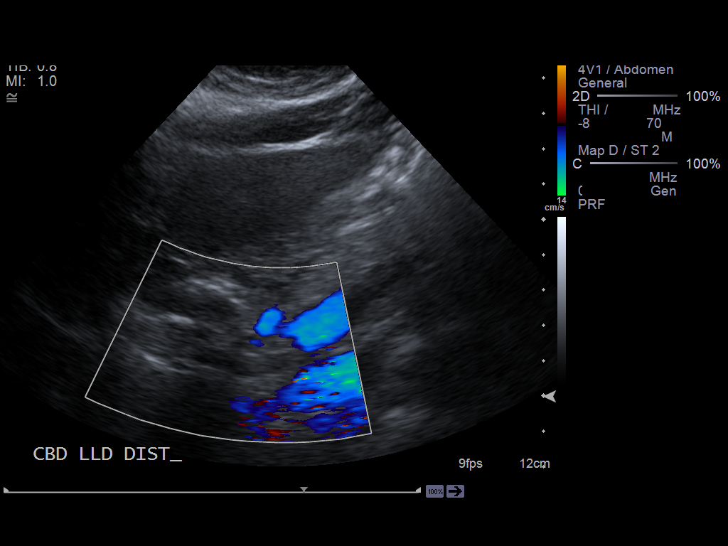
[im 47/47]
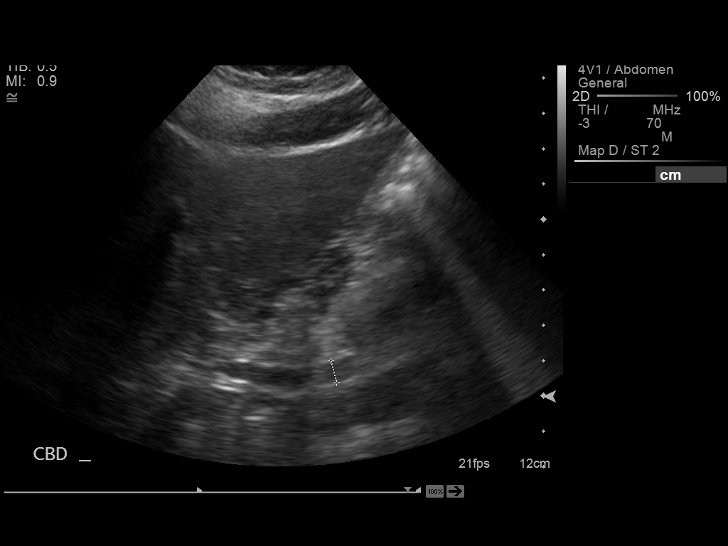

[14 of 25 positions shown; findings below may reference images not displayed]

FINDINGS: Gallbladder:  Removed

Common bile duct:  6.4 mm in diameter, within normal limits.

Liver:  Diffuse increased echogenicity of the liver parenchyma
consistent with hepatic steatosis.  No focal liver lesions.
IMPRESSION: Hepatic steatosis.  Otherwise,  normal exam.

## 2012-11-14 ENCOUNTER — Telehealth: Payer: Self-pay | Admitting: *Deleted

## 2012-11-14 NOTE — Telephone Encounter (Signed)
Pt called c/o hot flashes.  Encourage pt to take prescribed Effexor.  Gave instructions and emotional support.

## 2012-11-24 ENCOUNTER — Encounter (INDEPENDENT_AMBULATORY_CARE_PROVIDER_SITE_OTHER): Payer: Medicare (Managed Care) | Admitting: General Surgery

## 2012-12-19 ENCOUNTER — Other Ambulatory Visit: Payer: Medicare (Managed Care) | Admitting: Lab

## 2012-12-19 ENCOUNTER — Telehealth: Payer: Self-pay | Admitting: *Deleted

## 2012-12-19 ENCOUNTER — Ambulatory Visit: Payer: Medicare (Managed Care) | Admitting: Oncology

## 2012-12-19 NOTE — Telephone Encounter (Signed)
Pt called and is sick.  Will not be able to make appt.  R/s appt for 12/27/12 at 2:30

## 2012-12-23 ENCOUNTER — Encounter (INDEPENDENT_AMBULATORY_CARE_PROVIDER_SITE_OTHER): Payer: Self-pay | Admitting: General Surgery

## 2012-12-23 ENCOUNTER — Ambulatory Visit (INDEPENDENT_AMBULATORY_CARE_PROVIDER_SITE_OTHER): Payer: Medicare (Managed Care) | Admitting: General Surgery

## 2012-12-23 VITALS — BP 103/61 | HR 68 | Temp 97.3°F | Resp 14 | Ht 63.0 in | Wt 170.4 lb

## 2012-12-23 DIAGNOSIS — C50419 Malignant neoplasm of upper-outer quadrant of unspecified female breast: Secondary | ICD-10-CM

## 2012-12-23 NOTE — Progress Notes (Signed)
Patient ID: Angela Boyer, female   DOB: January 09, 1961, 52 y.o.   MRN: [id number] History: This patient underwent right partial mastectomy and sentinel node biopsy on 05/04/2012. Final pathology showed ductal carcinoma in situ, receptor positive. Good margins. Final stage Tis, N0. She had adjuvant radiation therapy. She was on tamoxifen initially but that was discontinued because of some shortness of breath and hypotension. She is now starting back on tamoxifen and also on Effexor for hot flashes. She is scheduled to see Dr. Drue Second next week  Exam: Patient looks fine. A little bit anxious chronically. In no distress Neck reveals no adenopathy mass or jugular venous distention Lungs are clear to auscultation bilaterally Heart regular rate and rhythm. No ectopy. Breast right breast reveals well healed lumpectomy scar superior lateral circumareolar area well-healed axillary scar. Skin is healthy. No mass. No axillary adenopathy. Left breast exam is unremarkable.  Assessment: Ductal carcinoma in situ right breast, no evidence of recurrent 6 months following right partial mastectomy and sentinel node biopsy  Plan: Continue tamoxifen and regular followup with Dr. Drue Second Bilateral mammograms May 2014 Return to see me in June 2014.    [name]. Derrell Lolling, M.D., [facility] General and Minimally invasive Surgery Breast and Colorectal Surgery Office:   [phone] Pager:   [phone]

## 2012-12-23 NOTE — Patient Instructions (Signed)
Examination of your breast and all of the lymph node areas are normal. There is no evidence of cancer.  The stiffness in your right shoulder will probably respond to some physical therapy. I advised to see orthopedic surgeon for an evaluation and I suspect that will get better with therapy.  Keep your appointment with Dr. Fayette Pho should be scheduled for bilateral mammograms in May of 2014.  Return to see Dr. Derrell Lolling in June of 2014.

## 2012-12-27 ENCOUNTER — Encounter: Payer: Self-pay | Admitting: Oncology

## 2012-12-27 ENCOUNTER — Telehealth: Payer: Self-pay | Admitting: Oncology

## 2012-12-27 ENCOUNTER — Ambulatory Visit (HOSPITAL_BASED_OUTPATIENT_CLINIC_OR_DEPARTMENT_OTHER): Payer: Medicare (Managed Care) | Admitting: Oncology

## 2012-12-27 ENCOUNTER — Other Ambulatory Visit (HOSPITAL_BASED_OUTPATIENT_CLINIC_OR_DEPARTMENT_OTHER): Payer: Medicare (Managed Care) | Admitting: Lab

## 2012-12-27 VITALS — BP 130/76 | HR 85 | Temp 98.2°F | Resp 20 | Ht 63.0 in | Wt 171.3 lb

## 2012-12-27 DIAGNOSIS — C50419 Malignant neoplasm of upper-outer quadrant of unspecified female breast: Secondary | ICD-10-CM

## 2012-12-27 DIAGNOSIS — Z170 Estrogen receptor positive status [ER+]: Secondary | ICD-10-CM

## 2012-12-27 LAB — CBC WITH DIFFERENTIAL/PLATELET
BASO%: 0.5 % (ref 0.0–2.0)
EOS%: 1.9 % (ref 0.0–7.0)
HCT: 43.1 % (ref 34.8–46.6)
LYMPH%: 24.1 % (ref 14.0–49.7)
MCH: 32.1 pg (ref 25.1–34.0)
MCHC: 34.4 g/dL (ref 31.5–36.0)
MCV: 93.2 fL (ref 79.5–101.0)
MONO%: 8.5 % (ref 0.0–14.0)
NEUT%: 65 % (ref 38.4–76.8)
lymph#: 1.5 10*3/uL (ref 0.9–3.3)

## 2012-12-27 LAB — COMPREHENSIVE METABOLIC PANEL (CC13)
ALT: 46 U/L (ref 0–55)
AST: 36 U/L — ABNORMAL HIGH (ref 5–34)
Alkaline Phosphatase: 110 U/L (ref 40–150)
BUN: 10 mg/dL (ref 7.0–26.0)
Chloride: 105 mEq/L (ref 98–107)
Creatinine: 0.9 mg/dL (ref 0.6–1.1)
Total Bilirubin: 0.5 mg/dL (ref 0.20–1.20)

## 2012-12-27 NOTE — Progress Notes (Signed)
ID: Angela Boyer   DOB: Sep 17, 1961  MR#: [id number]  WUJ#:[id number]  HISTORY OF PRESENT ILLNESS: The patient (goes by "Angela Boyer") had bilateral screening mammography 0502/2013 showing microcalcifications in the right breast. Additional views were obtained of 04/06/2012 and this confirmed a 7 mm cluster of pleomorphic calcifications in the upper outer quadrant of the right breast. Biopsy of the mass was performed the same day, and showed ([id number]) ductal carcinoma in situ, high-grade, estrogen and progesterone receptor both positive at 100%.  The patient proceeded to bilateral breast MRI 04/12/2012 showing only post biopsy change, with no masses or abnormal enhancement suspicious for malignancy in either breast and no adenopathy. Her subsequent history is as detailed below.  INTERVAL HISTORY: Patient is a 52 year old femalewho now returns for followup. On her last visit we had discussed starting her on tamoxifen adjuvantly but she has not done so. She does tell me that she is very weak tired and fatigued she has no energy whatsoever to do anything. She is also not eating as well as she thought she could. She is on Effexor and she is only taking 50 mg on a daily basis. She and I discussed increasing her dose. A detailed review of systems is otherwise stable and noncontributory.  PAST MEDICAL HISTORY: Past Medical History  Diagnosis Date  . Colitis   . Ischemic colitis, enteritis, or enterocolitis   . Hypertension   . GERD (gastroesophageal reflux disease)   . Anxiety   . H/O colonoscopy 2012  . Night sweats   . Wears glasses   . Depression   . Hot flashes   . Burning tongue syndrome   . Breast cancer 05/04/12    right breast lumpectomy=Ductal carcinoma in situw/comedo necrosis&calcifications,ER?PR=positivedx 04/06/12  . Breast cancer   . Breast cancer     RT diagnosed this year   chronic pain syndrome, the patient receiving all pain medicines through a pain clinic in [place].  PAST  SURGICAL HISTORY: Past Surgical History  Procedure Date  . Diagnostic laparoscopy     ischemic colitis  . Colon surgery     partial colectomy during exploratory surg  . 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 Breast lumpectomy 05/04/12    right breast  . Breast biopsy 04/06/12    right breast bx 11 0'clock  . Abdominal hysterectomy 2009    ovaries and tubes intact  . Appendectomy     30 years ago  . Cholecystectomy 2012   she had a ruptured of central nervous system aneurysm approximately 12 years ago, which resulted on her being disabled. She did not have her ovaries removed at the time of her hysterectomy.  FAMILY HISTORY Family History  Problem Relation Age of Onset  . Breast cancer Mother   . Cancer Mother     breast  . Hypertension Mother   . Melanoma Father   . Cancer Father     melanoma  . Diabetes Father   . Hypertension Father   . Hypertension Brother    The patient's parents are alive. Her mother was diagnosed with breast cancer at the age of 74. She underwent left mastectomy and is doing well currently age 53. The patient's father had what sounds like squamous cell cancers of the face secondary to sun exposure, clearly not melanoma. The patient has one brother, no sisters. There are no other cancers in the immediate family.  GYNECOLOGIC HISTORY: Menarche age 61, GX P80, with first live birth at  age 58. She status post hysterectomy without salpingo-oophorectomy. She took hormone replacement for about 4 months, stopping April 2013.  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 Her husband Wandalene Abrams (goes by Clovis Riley) is a Chiropractor. Daughter Vinetta Bergamo lives in [place] and owns a Education officer, environmental business. Daughter Casi. Darin lives in [place] at and works at a group home for local children. Son Grover Woodfield lives in [place] and workes for the department of [employer]. The  patient has 2 grandchildren. She is not a church attender  ADVANCED DIRECTIVES: Not in place  HEALTH MAINTENANCE: History  Substance Use Topics  . Smoking status: Former Smoker -- .5 years    Types: Cigarettes    Quit date: 05/19/1992  . Smokeless tobacco: Never Used  . Alcohol Use: No     Colonoscopy: 2011/[facility]  PAP: s/p hysterectomy  Bone density: never  Lipid panel:  Allergies  Allergen Reactions  . Tape     Thin skin   . Vicodin (Hydrocodone-Acetaminophen) Nausea And Vomiting    Current Outpatient Prescriptions  Medication Sig Dispense Refill  . amLODipine (NORVASC) 10 MG tablet Take 10 mg by mouth daily.        . benazepril (LOTENSIN) 20 MG tablet Take 20 mg by mouth daily.        . clonazePAM (KLONOPIN) 0.5 MG tablet Take 0.5 mg by mouth daily.       Marland Kitchen gabapentin (NEURONTIN) 600 MG tablet Take 600 mg by mouth 2 (two) times daily.        Marland Kitchen LORazepam (ATIVAN) 0.5 MG tablet Take 0.5 mg by mouth as needed. Panic Attacks      . omeprazole (PRILOSEC) 40 MG capsule Take 40 mg by mouth daily.        Marland Kitchen oxyCODONE-acetaminophen (PERCOCET) 7.5-325 MG per tablet Take 1 tablet by mouth every 4 (four) hours as needed.      . venlafaxine (EFFEXOR) 75 MG tablet Take 75 mg by mouth at bedtime as needed.      . zolpidem (AMBIEN) 10 MG tablet Take 10 mg by mouth as needed. Sleep      . tamoxifen (NOLVADEX) 10 MG tablet Take 10 mg by mouth 2 (two) times daily.        OBJECTIVE: Middle-aged white woman  who appears comfortable and in no acute distress  Filed Vitals:   12/27/12 1414  BP: 130/76  Pulse: 85  Temp: 98.2 F (36.8 C)  Resp: 20     Body mass index is 30.34 kg/(m^2).    ECOG FS:0 Filed Weights   12/27/12 1414  Weight: 171 lb 4.8 oz (77.701 kg)   Sclerae unicteric Oropharynx clear No peripheral adenopathy Lungs no rales or rhonchi Heart regular rate and rhythm Abd soft, nontender; positive bowel sounds MSK no focal spinal tenderness, no peripheral edema Neuro:  nonfocal, alert and oriented x3 Breasts: The right breast is status post lumpectomy. Mild skin changes secondary to radiation therapy. No suspicious nodularity. No evidence of local recurrence. Axillae are benign bilaterally with no adenopathy    LAB RESULTS: Lab Results  Component Value Date   WBC 6.4 12/27/2012   NEUTROABS 4.2 12/27/2012   HGB 14.8 12/27/2012   HCT 43.1 12/27/2012   MCV 93.2 12/27/2012   PLT 241 12/27/2012      Chemistry      Component Value Date/Time   NA 141 10/17/2012 1102   NA 140  08/02/2012 1535   K 4.5 10/17/2012 1102   K 4.0 08/02/2012 1535   CL 104 10/17/2012 1102   CL 104 08/02/2012 1535   CO2 30* 10/17/2012 1102   CO2 28 08/02/2012 1535   BUN 14.0 10/17/2012 1102   BUN 15 08/02/2012 1535   CREATININE 0.8 10/17/2012 1102   CREATININE 0.69 08/02/2012 1535   CREATININE 0.74 06/16/2012 1115      Component Value Date/Time   CALCIUM 9.7 10/17/2012 1102   CALCIUM 9.1 08/02/2012 1535   ALKPHOS 113 10/17/2012 1102   ALKPHOS 112 08/02/2012 1535   AST 33 10/17/2012 1102   AST 79* 08/02/2012 1535   ALT 60* 10/17/2012 1102   ALT 99* 08/02/2012 1535   BILITOT 0.64 10/17/2012 1102   BILITOT 0.4 08/02/2012 1535       Lab Results  Component Value Date   LABCA2 25 08/11/2012    STUDIES:  No results found.   ASSESSMENT: 52 y.o.  [place] woman   (1)  status post right lumpectomy and sentinel node biopsy  05/04/2012 for a ductal carcinoma in situ, grade 3, strongly estrogen and progesterone receptor positive, measuring 1.5 cm, with negative and ample margins  (2)  Status post radiation therapy, completed in late August 2013  (3)  patient was begun on tamoxifen but she discontinued it on her own. Her original dose was 20 mg on a daily basis. But she has not been taking it for quite some time. She and I today discussed the rationale for tamoxifen. She understands. We opted to start her at 10 mg on a daily basis.patient has not been taking the tamoxifen as I have  counseled her.however she does comment that she is willing to try again.  #4 patient also was counseled about her anxiety. We did discuss hot flashes. I think she may benefit from starting Effexor. I gave her a prescription for it today.  #5 patient and I had also discussed exercise and eating healthy. I am afraid that she is not doing that either.  she seems to be having a very difficult time staying at home alone while her husband works. She has basically isolated herself from the rest of the world while she is in [place]. However she tells me that when she goes to [place] to be with her daughter and granddaughter she seems to interact with other individuals quite nicely.  PLAN:   #1 patient will try to start tamoxifen at 10 mg again.  #2 She will continue taking Effexor and try to maximize the dose to 75 mg on a daily basis.  #3 she will try to exercise and eat healthy.  #4 I will plan on seeing her back in 6 months time in followup  Drue Second, MD Medical/Oncology [facility] [phone] (beeper) [phone] (Office)

## 2012-12-27 NOTE — Patient Instructions (Addendum)
Exercise 20 minutes everyday  I will see you back in 1 month

## 2012-12-27 NOTE — Telephone Encounter (Signed)
gv pt appt schedule for March. °

## 2013-01-27 ENCOUNTER — Ambulatory Visit: Payer: Medicare (Managed Care) | Admitting: Oncology

## 2013-01-30 ENCOUNTER — Telehealth: Payer: Self-pay | Admitting: Oncology

## 2013-01-30 NOTE — Telephone Encounter (Signed)
lmonvm for pt re appt for 3/14 and mailed schedule.

## 2013-02-03 ENCOUNTER — Ambulatory Visit: Payer: Medicare (Managed Care) | Admitting: Oncology

## 2013-02-13 ENCOUNTER — Telehealth: Payer: Self-pay | Admitting: *Deleted

## 2013-02-13 ENCOUNTER — Other Ambulatory Visit: Payer: Self-pay | Admitting: *Deleted

## 2013-02-13 ENCOUNTER — Ambulatory Visit: Payer: Medicare (Managed Care) | Admitting: Oncology

## 2013-02-13 DIAGNOSIS — C50411 Malignant neoplasm of upper-outer quadrant of right female breast: Secondary | ICD-10-CM

## 2013-02-13 NOTE — Telephone Encounter (Signed)
Pt called to r/s appt with Dr. Welton Flakes today since she is going out of town tomorrow she wanted to come to see provider tomorrow.  R/S appt for pt to see Angela Peeling, NP.

## 2013-02-14 ENCOUNTER — Other Ambulatory Visit: Payer: Medicare (Managed Care) | Admitting: Lab

## 2013-02-14 ENCOUNTER — Ambulatory Visit: Payer: Medicare (Managed Care) | Admitting: Adult Health

## 2013-02-20 ENCOUNTER — Other Ambulatory Visit (HOSPITAL_BASED_OUTPATIENT_CLINIC_OR_DEPARTMENT_OTHER): Payer: Medicare (Managed Care) | Admitting: Lab

## 2013-02-20 ENCOUNTER — Encounter: Payer: Self-pay | Admitting: Oncology

## 2013-02-20 ENCOUNTER — Ambulatory Visit (HOSPITAL_BASED_OUTPATIENT_CLINIC_OR_DEPARTMENT_OTHER): Payer: Medicare (Managed Care) | Admitting: Adult Health

## 2013-02-20 ENCOUNTER — Telehealth: Payer: Self-pay | Admitting: Oncology

## 2013-02-20 VITALS — BP 125/76 | HR 88 | Temp 88.0°F | Resp 20 | Ht 63.0 in | Wt 172.3 lb

## 2013-02-20 DIAGNOSIS — Z853 Personal history of malignant neoplasm of breast: Secondary | ICD-10-CM

## 2013-02-20 DIAGNOSIS — F411 Generalized anxiety disorder: Secondary | ICD-10-CM

## 2013-02-20 DIAGNOSIS — C50411 Malignant neoplasm of upper-outer quadrant of right female breast: Secondary | ICD-10-CM

## 2013-02-20 DIAGNOSIS — C50419 Malignant neoplasm of upper-outer quadrant of unspecified female breast: Secondary | ICD-10-CM

## 2013-02-20 DIAGNOSIS — Z170 Estrogen receptor positive status [ER+]: Secondary | ICD-10-CM

## 2013-02-20 LAB — COMPREHENSIVE METABOLIC PANEL (CC13)
ALT: 69 U/L — ABNORMAL HIGH (ref 0–55)
AST: 54 U/L — ABNORMAL HIGH (ref 5–34)
BUN: 10.9 mg/dL (ref 7.0–26.0)
Calcium: 9.1 mg/dL (ref 8.4–10.4)
Chloride: 106 mEq/L (ref 98–107)
Creatinine: 0.8 mg/dL (ref 0.6–1.1)
Total Bilirubin: 0.5 mg/dL (ref 0.20–1.20)

## 2013-02-20 LAB — CBC WITH DIFFERENTIAL/PLATELET
BASO%: 0.5 % (ref 0.0–2.0)
Basophils Absolute: 0 10*3/uL (ref 0.0–0.1)
EOS%: 2.2 % (ref 0.0–7.0)
HCT: 41.7 % (ref 34.8–46.6)
HGB: 14.1 g/dL (ref 11.6–15.9)
LYMPH%: 21.9 % (ref 14.0–49.7)
MCH: 31.7 pg (ref 25.1–34.0)
MCHC: 33.9 g/dL (ref 31.5–36.0)
MCV: 93.6 fL (ref 79.5–101.0)
MONO%: 9.8 % (ref 0.0–14.0)
NEUT%: 65.6 % (ref 38.4–76.8)
Platelets: 228 10*3/uL (ref 145–400)
lymph#: 1.5 10*3/uL (ref 0.9–3.3)

## 2013-02-20 NOTE — Telephone Encounter (Signed)
gv pt appt schedule for June and mammo for 5/16.

## 2013-02-20 NOTE — Progress Notes (Signed)
ID: Angela Boyer   DOB: [birth year]  MR#: [id number]  WUJ#:[id number]  HISTORY OF PRESENT ILLNESS: The patient (goes by "Angela Boyer") had bilateral screening mammography 0502/2013 showing microcalcifications in the right breast. Additional views were obtained of 04/06/2012 and this confirmed a 7 mm cluster of pleomorphic calcifications in the upper outer quadrant of the right breast. Biopsy of the mass was performed the same day, and showed ([id number]) ductal carcinoma in situ, high-grade, estrogen and progesterone receptor both positive at 100%.  The patient proceeded to bilateral breast MRI 04/12/2012 showing only post biopsy change, with no masses or abnormal enhancement suspicious for malignancy in either breast and no adenopathy. Her subsequent history is as detailed below.  INTERVAL HISTORY: Patient is a 52 year old femalewho now returns for followup. She has attempted to take Tamoxifen three times and has not been able to do so due to the hot flashes.  She continues to take Effexor 50mg  daily.  She has tried to take Effexor 75mg  a day and it makes her too sleepy.  She has had some right arm pain, went to her PCP and they did x rays which were normal.  She has continued to have intermittent pain in  Her bilateral upper extremities in the tricep area. She continues to have hot flashes due to being peri-menopausal.  She has had a hysterectomy but retained her ovaries.  She is interested to know her risk about relapse with and without the tamoxifen.      PAST MEDICAL HISTORY: Past Medical History  Diagnosis Date  . Colitis   . Ischemic colitis, enteritis, or enterocolitis   . Hypertension   . GERD (gastroesophageal reflux disease)   . Anxiety   . H/O colonoscopy 2012  . Night sweats   . Wears glasses   . Depression   . Hot flashes   . Burning tongue syndrome   . Breast cancer 05/04/12    right breast lumpectomy=Ductal carcinoma in situw/comedo necrosis&calcifications,ER?PR=positivedx  04/06/12  . Breast cancer   . Breast cancer     RT diagnosed this year   chronic pain syndrome, the patient receiving all pain medicines through a pain clinic in [place].  PAST SURGICAL HISTORY: Past Surgical History  Procedure Laterality Date  . Diagnostic laparoscopy      ischemic colitis  . Colon surgery      partial colectomy during exploratory surg  . Brain surgery  2001    clips 2 cerebral aneuis  . Breast lumpectomy  05/04/12    right breast  . Breast biopsy  04/06/12    right breast bx 11 0'clock  . Abdominal hysterectomy  2009    ovaries and tubes intact  . Appendectomy      30 years ago  . Cholecystectomy  2012   she had a ruptured of central nervous system aneurysm approximately 12 years ago, which resulted on her being disabled. She did not have her ovaries removed at the time of her hysterectomy.  FAMILY HISTORY Family History  Problem Relation Age of Onset  . Breast cancer Mother   . Cancer Mother     breast  . Hypertension Mother   . Melanoma Father   . Cancer Father     melanoma  . Diabetes Father   . Hypertension Father   . Hypertension Brother    The patient's parents are alive. Her mother was diagnosed with breast cancer at the age of 63. She underwent left mastectomy and is doing well currently age 21.  The patient's father had what sounds like squamous cell cancers of the face secondary to sun exposure, clearly not melanoma. The patient has one brother, no sisters. There are no other cancers in the immediate family.  GYNECOLOGIC HISTORY: Menarche age 17, GX P63, with first live birth at age 55. She status post hysterectomy without salpingo-oophorectomy. She took hormone replacement for about 4 months, stopping April 2013.  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 Her husband Aili Casillas (goes by Clovis Riley) is a Chiropractor. Daughter Vinetta Bergamo lives in [place] and  owns a Education officer, environmental business. Daughter Casi. Belcher lives in [place] at and works at a group home for local children. Son Cola Gane lives in [place] and workes for the department of [employer]. The patient has 2 grandchildren. She is not a church attender  ADVANCED DIRECTIVES: Not in place  HEALTH MAINTENANCE: History  Substance Use Topics  . Smoking status: Former Smoker -- .5 years    Types: Cigarettes    Quit date: 05/19/1992  . Smokeless tobacco: Never Used  . Alcohol Use: No     Colonoscopy: 2011/[facility]  PAP: s/p hysterectomy  Bone density: never  Lipid panel:  Allergies  Allergen Reactions  . Tape     Thin skin   . Vicodin (Hydrocodone-Acetaminophen) Nausea And Vomiting    Current Outpatient Prescriptions  Medication Sig Dispense Refill  . amLODipine (NORVASC) 10 MG tablet Take 10 mg by mouth daily.        Marland Kitchen atorvastatin (LIPITOR) 10 MG tablet Take 10 mg by mouth daily.      . benazepril (LOTENSIN) 20 MG tablet Take 20 mg by mouth daily.        . clonazePAM (KLONOPIN) 0.5 MG tablet Take 0.5 mg by mouth daily.       Marland Kitchen gabapentin (NEURONTIN) 600 MG tablet Take 600 mg by mouth 2 (two) times daily.        Marland Kitchen LORazepam (ATIVAN) 0.5 MG tablet Take 0.5 mg by mouth as needed. Panic Attacks      . omeprazole (PRILOSEC) 40 MG capsule Take 40 mg by mouth daily.        Marland Kitchen oxyCODONE-acetaminophen (PERCOCET) 7.5-325 MG per tablet Take 1 tablet by mouth every 4 (four) hours as needed.      . venlafaxine (EFFEXOR) 75 MG tablet Take 75 mg by mouth at bedtime as needed.      . zolpidem (AMBIEN) 10 MG tablet Take 10 mg by mouth as needed. Sleep      . tamoxifen (NOLVADEX) 10 MG tablet Take 10 mg by mouth 2 (two) times daily.       No current facility-administered medications for this visit.    OBJECTIVE: Middle-aged white woman  who appears comfortable and in no acute distress  Filed Vitals:   02/20/13 1102  BP: 125/76  Pulse: 88  Temp: 88 F (31.1 C)  Resp: 20     Body mass  index is 30.53 kg/(m^2).    ECOG FS:0 Filed Weights   02/20/13 1102  Weight: 172 lb 4.8 oz (78.155 kg)   Sclerae unicteric Oropharynx clear No peripheral adenopathy Lungs no rales or rhonchi Heart regular rate and rhythm Abd soft, nontender; positive bowel sounds MSK no focal spinal tenderness, no peripheral edema Neuro: nonfocal, alert and oriented x3 Breasts: The right breast is status post lumpectomy. Mild skin changes secondary to radiation therapy. No suspicious nodularity. No evidence  of local recurrence. Axillae are benign bilaterally with no adenopathy    LAB RESULTS: Lab Results  Component Value Date   WBC 6.7 02/20/2013   NEUTROABS 4.4 02/20/2013   HGB 14.1 02/20/2013   HCT 41.7 02/20/2013   MCV 93.6 02/20/2013   PLT 228 02/20/2013      Chemistry      Component Value Date/Time   NA 143 02/20/2013 1053   NA 140 08/02/2012 1535   K 4.1 02/20/2013 1053   K 4.0 08/02/2012 1535   CL 106 02/20/2013 1053   CL 104 08/02/2012 1535   CO2 28 02/20/2013 1053   CO2 28 08/02/2012 1535   BUN 10.9 02/20/2013 1053   BUN 15 08/02/2012 1535   CREATININE 0.8 02/20/2013 1053   CREATININE 0.69 08/02/2012 1535   CREATININE 0.74 06/16/2012 1115      Component Value Date/Time   CALCIUM 9.1 02/20/2013 1053   CALCIUM 9.1 08/02/2012 1535   ALKPHOS 107 02/20/2013 1053   ALKPHOS 112 08/02/2012 1535   AST 54* 02/20/2013 1053   AST 79* 08/02/2012 1535   ALT 69* 02/20/2013 1053   ALT 99* 08/02/2012 1535   BILITOT 0.50 02/20/2013 1053   BILITOT 0.4 08/02/2012 1535       Lab Results  Component Value Date   LABCA2 25 08/11/2012    STUDIES:  No results found.   ASSESSMENT: 89 y.o.  [place] woman   (1)  status post right lumpectomy and sentinel node biopsy  05/04/2012 for a ductal carcinoma in situ, grade 3, strongly estrogen and progesterone receptor positive, measuring 1.5 cm, with negative and ample margins  (2)  Status post radiation therapy, completed in late August 2013  (3)  patient was  begun on tamoxifen but she discontinued it on her own. Her original dose was 20 mg on a daily basis. But she has not been taking it for quite some time. She and I today discussed the rationale for tamoxifen. She understands. We opted to start her at 10 mg on a daily basis.patient has not been taking the tamoxifen as I have counseled her.however she does comment that she is willing to try again.  #4 patient also was counseled about her anxiety. We did discuss hot flashes. I think she may benefit from starting Effexor. I gave her a prescription for it today.  #5 patient and I had also discussed exercise and eating healthy. I am afraid that she is not doing that either.  she seems to be having a very difficult time staying at home alone while her husband works. She has basically isolated herself from the rest of the world while she is in [place]. However she tells me that when she goes to [place] to be with her daughter and granddaughter she seems to interact with other individuals quite nicely.  PLAN:   #1 patient will try to start tamoxifen at 10 mg again, and will give it good effort.  I discussed with her risks and benefits.  She will get an eye exam, and will go for annual mammo in May 2014.    #2 She will continue taking Effexor and try to maximize the dose to 75 mg on a daily basis.  #3 she will try to exercise and eat healthy.  #4 I will plan on seeing her back in 3 months time in followup or earlier if needed.   I spent 25 minutes counseling the patient face to face.  The total time spent in  the appointment was 30 minutes.  Cherie Ouch Lyn Hollingshead, NP Medical Oncology [facility] Phone: [phone]

## 2013-02-20 NOTE — Patient Instructions (Addendum)
Doing well.  Please restart the Tamoxifen daily.  I recommend an eye exam.  We will see you back in 3 months.

## 2013-04-07 ENCOUNTER — Ambulatory Visit
Admission: RE | Admit: 2013-04-07 | Discharge: 2013-04-07 | Disposition: A | Discharge status: Home / Self Care | Payer: Medicare (Managed Care) | Source: Ambulatory Visit | Attending: Adult Health | Admitting: Adult Health

## 2013-04-07 DIAGNOSIS — Z853 Personal history of malignant neoplasm of breast: Secondary | ICD-10-CM

## 2013-05-16 ENCOUNTER — Ambulatory Visit (INDEPENDENT_AMBULATORY_CARE_PROVIDER_SITE_OTHER): Payer: Medicare (Managed Care) | Admitting: General Surgery

## 2013-05-22 ENCOUNTER — Telehealth: Payer: Self-pay | Admitting: *Deleted

## 2013-05-22 ENCOUNTER — Ambulatory Visit: Payer: Medicare (Managed Care) | Admitting: Oncology

## 2013-05-22 ENCOUNTER — Other Ambulatory Visit: Payer: Medicare (Managed Care) | Admitting: Lab

## 2013-05-22 ENCOUNTER — Encounter: Payer: Self-pay | Admitting: Oncology

## 2013-05-22 ENCOUNTER — Ambulatory Visit (HOSPITAL_BASED_OUTPATIENT_CLINIC_OR_DEPARTMENT_OTHER): Payer: Medicare (Managed Care) | Admitting: Oncology

## 2013-05-22 ENCOUNTER — Other Ambulatory Visit (HOSPITAL_BASED_OUTPATIENT_CLINIC_OR_DEPARTMENT_OTHER): Payer: Medicare (Managed Care) | Admitting: Lab

## 2013-05-22 VITALS — BP 138/88 | HR 85 | Temp 97.7°F | Resp 20 | Ht 63.0 in | Wt 172.0 lb

## 2013-05-22 DIAGNOSIS — C50411 Malignant neoplasm of upper-outer quadrant of right female breast: Secondary | ICD-10-CM

## 2013-05-22 DIAGNOSIS — C50419 Malignant neoplasm of upper-outer quadrant of unspecified female breast: Secondary | ICD-10-CM

## 2013-05-22 LAB — CBC WITH DIFFERENTIAL/PLATELET
Basophils Absolute: 0 10*3/uL (ref 0.0–0.1)
Eosinophils Absolute: 0.1 10*3/uL (ref 0.0–0.5)
LYMPH%: 28.6 % (ref 14.0–49.7)
MCV: 92.8 fL (ref 79.5–101.0)
MONO%: 9.2 % (ref 0.0–14.0)
NEUT#: 4.6 10*3/uL (ref 1.5–6.5)
Platelets: 232 10*3/uL (ref 145–400)
RBC: 4.53 10*6/uL (ref 3.70–5.45)

## 2013-05-22 LAB — COMPREHENSIVE METABOLIC PANEL (CC13)
ALT: 63 U/L — ABNORMAL HIGH (ref 0–55)
AST: 38 U/L — ABNORMAL HIGH (ref 5–34)
Alkaline Phosphatase: 108 U/L (ref 40–150)
BUN: 17.6 mg/dL (ref 7.0–26.0)
CO2: 28 mEq/L (ref 22–29)
Chloride: 105 mEq/L (ref 98–109)
Glucose: 102 mg/dl (ref 70–140)
Potassium: 4 mEq/L (ref 3.5–5.1)
Total Protein: 7.4 g/dL (ref 6.4–8.3)

## 2013-05-22 NOTE — Patient Instructions (Addendum)
#  1 patient is doing very well. She still continues to have hot flashes however the Effexor that she is taking is helping her.  #2 for her DCIS she is just being observed she is not taking the tamoxifen due to all of the side effects that she experiences.  #3 liver function studies are improving slowly. We will continue to monitor this.  #4 patient still has low libido and we discussed the possibility of the colon to see her sex therapist to resume intimacy.  #5 patient is now thinking about going ahead and talking to a Dentist. I will make a referral for this.  #6 patient also would like to see a gynecologist for ongoing Pap smears and general good health. I will refer her to Dr. [facility] at [facility]. The phone number is [phone]

## 2013-05-22 NOTE — Progress Notes (Signed)
ID: Angela Boyer   DOB: March 02, 1961  MR#: [id number]  WUJ#:[id number]  HISTORY OF PRESENT ILLNESS: The patient (goes by "Angela Boyer") had bilateral screening mammography 0502/2013 showing microcalcifications in the right breast. Additional views were obtained of 04/06/2012 and this confirmed a 7 mm cluster of pleomorphic calcifications in the upper outer quadrant of the right breast. Biopsy of the mass was performed the same day, and showed ([id number]) ductal carcinoma in situ, high-grade, estrogen and progesterone receptor both positive at 100%.  The patient proceeded to bilateral breast MRI 04/12/2012 showing only post biopsy change, with no masses or abnormal enhancement suspicious for malignancy in either breast and no adenopathy. Her subsequent history is as detailed below.  INTERVAL HISTORY: Patient is a 52 year old femalewho now returns for followup. Patient overall seems to be doing a little bit better. She however is not taking the tamoxifen since she could not tolerated. She does continue to take the Effexor and tolerating it well. She does tell me that the Effexor helps her with the hot flashes. She today is concerned about her daughter and grandchildren and they're risk of developing breast cancer. She would like to be seen by a Dentist. She also would like to be seen by a gynecologist for ongoing evaluation and have the Pap smear. I gave her the appropriate referrals today. She denies having any nausea vomiting no headaches double vision she does have a little bit of right breast tenderness at the surgical site which I reassured her is normal. She has ongoing myalgias and arthralgias especially in her shoulders. She is now beginning to exercise more. She is doing Zumba classes. Remainder of the 10 point review of systems is negative   PAST MEDICAL HISTORY: Past Medical History  Diagnosis Date  . Colitis   . Ischemic colitis, enteritis, or enterocolitis   . Hypertension   . GERD  (gastroesophageal reflux disease)   . Anxiety   . H/O colonoscopy 2012  . Night sweats   . Wears glasses   . Depression   . Hot flashes   . Burning tongue syndrome   . Breast cancer 05/04/12    right breast lumpectomy=Ductal carcinoma in situw/comedo necrosis&calcifications,ER?PR=positivedx 04/06/12  . Breast cancer   . Breast cancer     RT diagnosed this year   chronic pain syndrome, the patient receiving all pain medicines through a pain clinic in [place].  PAST SURGICAL HISTORY: Past Surgical History  Procedure Laterality Date  . Diagnostic laparoscopy      ischemic colitis  . Colon surgery      partial colectomy during exploratory surg  . Brain surgery  2001    clips 2 cerebral aneuis  . Breast lumpectomy  05/04/12    right breast  . Breast biopsy  04/06/12    right breast bx 11 0'clock  . Abdominal hysterectomy  2009    ovaries and tubes intact  . Appendectomy      30 years ago  . Cholecystectomy  2012   she had a ruptured of central nervous system aneurysm approximately 12 years ago, which resulted on her being disabled. She did not have her ovaries removed at the time of her hysterectomy.  FAMILY HISTORY Family History  Problem Relation Age of Onset  . Breast cancer Mother   . Cancer Mother     breast  . Hypertension Mother   . Melanoma Father   . Cancer Father     melanoma  . Diabetes Father   .  Hypertension Father   . Hypertension Brother    The patient's parents are alive. Her mother was diagnosed with breast cancer at the age of 85. She underwent left mastectomy and is doing well currently age 86. The patient's father had what sounds like squamous cell cancers of the face secondary to sun exposure, clearly not melanoma. The patient has one brother, no sisters. There are no other cancers in the immediate family.  GYNECOLOGIC HISTORY: Menarche age 36, GX P29, with first live birth at age 33. She status post hysterectomy without salpingo-oophorectomy. She took  hormone replacement for about 4 months, stopping April 2013.  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 Her husband Alfreda Hammad (goes by Clovis Riley) is a Chiropractor. Daughter Vinetta Bergamo lives in [place] and owns a Education officer, environmental business. Daughter Casi. Isip lives in [place] at and works at a group home for local children. Son Zyan Mirkin lives in [place] and workes for the department of [employer]. The patient has 2 grandchildren. She is not a church attender  ADVANCED DIRECTIVES: Not in place  HEALTH MAINTENANCE: History  Substance Use Topics  . Smoking status: Former Smoker -- .5 years    Types: Cigarettes    Quit date: 05/19/1992  . Smokeless tobacco: Never Used  . Alcohol Use: No     Colonoscopy: 2011/[facility]  PAP: s/p hysterectomy  Bone density: never  Lipid panel:  Allergies  Allergen Reactions  . Tape     Thin skin   . Vicodin (Hydrocodone-Acetaminophen) Nausea And Vomiting    Current Outpatient Prescriptions  Medication Sig Dispense Refill  . amLODipine (NORVASC) 10 MG tablet Take 10 mg by mouth daily.        Marland Kitchen atorvastatin (LIPITOR) 10 MG tablet Take 10 mg by mouth daily.      . benazepril (LOTENSIN) 20 MG tablet Take 20 mg by mouth daily.        . clonazePAM (KLONOPIN) 0.5 MG tablet Take 0.5 mg by mouth daily.       Marland Kitchen gabapentin (NEURONTIN) 600 MG tablet Take 600 mg by mouth 2 (two) times daily.        Marland Kitchen LORazepam (ATIVAN) 0.5 MG tablet Take 0.5 mg by mouth as needed. Panic Attacks      . omeprazole (PRILOSEC) 40 MG capsule Take 40 mg by mouth daily.        Marland Kitchen oxyCODONE-acetaminophen (PERCOCET) 7.5-325 MG per tablet Take 1 tablet by mouth every 4 (four) hours as needed.      . venlafaxine (EFFEXOR) 75 MG tablet Take 75 mg by mouth at bedtime as needed.      . zolpidem (AMBIEN) 10 MG tablet Take 10 mg by mouth as needed. Sleep       No current facility-administered medications  for this visit.    OBJECTIVE: Middle-aged white woman  who appears comfortable and in no acute distress  Filed Vitals:   05/22/13 1422  BP: 138/88  Pulse: 85  Temp: 97.7 F (36.5 C)  Resp: 20     Body mass index is 30.48 kg/(m^2).    ECOG FS:0 Filed Weights   05/22/13 1422  Weight: 172 lb (78.019 kg)   Sclerae unicteric Oropharynx clear No peripheral adenopathy Lungs no rales or rhonchi Heart regular rate and rhythm Abd soft, nontender; positive bowel sounds MSK no focal spinal tenderness, no peripheral edema Neuro: nonfocal, alert and oriented x3 Breasts: The right breast  is status post lumpectomy. Mild skin changes secondary to radiation therapy. No suspicious nodularity. No evidence of local recurrence. Axillae are benign bilaterally with no adenopathy    LAB RESULTS: Lab Results  Component Value Date   WBC 7.6 05/22/2013   NEUTROABS 4.6 05/22/2013   HGB 14.5 05/22/2013   HCT 42.0 05/22/2013   MCV 92.8 05/22/2013   PLT 232 05/22/2013      Chemistry      Component Value Date/Time   NA 141 05/22/2013 1401   NA 140 08/02/2012 1535   K 4.0 05/22/2013 1401   K 4.0 08/02/2012 1535   CL 106 02/20/2013 1053   CL 104 08/02/2012 1535   CO2 28 05/22/2013 1401   CO2 28 08/02/2012 1535   BUN 17.6 05/22/2013 1401   BUN 15 08/02/2012 1535   CREATININE 0.9 05/22/2013 1401   CREATININE 0.69 08/02/2012 1535   CREATININE 0.74 06/16/2012 1115      Component Value Date/Time   CALCIUM 9.8 05/22/2013 1401   CALCIUM 9.1 08/02/2012 1535   ALKPHOS 108 05/22/2013 1401   ALKPHOS 112 08/02/2012 1535   AST 38* 05/22/2013 1401   AST 79* 08/02/2012 1535   ALT 63* 05/22/2013 1401   ALT 99* 08/02/2012 1535   BILITOT 0.57 05/22/2013 1401   BILITOT 0.4 08/02/2012 1535       Lab Results  Component Value Date   LABCA2 25 08/11/2012    STUDIES:  No results found.   ASSESSMENT: 52 y.o.  [place] woman   (1)  status post right lumpectomy and sentinel node biopsy  05/04/2012 for a ductal carcinoma in  situ, grade 3, strongly estrogen and progesterone receptor positive, measuring 1.5 cm, with negative and ample margins  (2)  Status post radiation therapy, completed in late August 2013  (3)  patient was begun on tamoxifen but she discontinued it on her own. Her original dose was 20 mg on a daily basis. But she has not been taking it for quite some time. She and I today discussed the rationale for tamoxifen. She understands. We opted to start her at 10 mg on a daily basis.patient has not been taking the tamoxifen as I have counseled her.however she does comment that she is willing to try again. Patient has discontinued the tamoxifen and feels much better.  #4 patient also was counseled about her anxiety. We did discuss hot flashes. I think she may benefit from starting Effexor. I gave her a prescription for it today.  #5 patient and I had also discussed exercise and eating healthy. I am afraid that she is not doing that either.  she seems to be having a very difficult time staying at home alone while her husband works. She has basically isolated herself from the rest of the world while she is in [place]. However she tells me that when she goes to [place] to be with her daughter and granddaughter she seems to interact with other individuals quite nicely.  PLAN:  #1 patient is doing very well. She still continues to have hot flashes however the Effexor that she is taking is helping her.  #2 for her DCIS she is just being observed she is not taking the tamoxifen due to all of the side effects that she experiences.  #3 liver function studies are improving slowly. We will continue to monitor this.  #4 patient still has low libido and we discussed the possibility of her and her husband seeing a sex therapist to resume  intimacy.  #5 patient is now thinking about going ahead and talking to a Dentist. I will make a referral for this.  #6 patient also would like to see a  gynecologist for ongoing Pap smears and general good health. I will refer her to [facility]. The phone number is [phone]  #7 patient will return in 6 months time for followup.  I spent 25 minutes counseling the patient face to face.  The total time spent in the appointment was 30 minutes.  Cherie Ouch Lyn Hollingshead, NP Medical Oncology [facility] Phone: [phone]

## 2013-05-22 NOTE — Telephone Encounter (Signed)
appts made and printed. Pt is aware that Misty Stanley will call her with her genetic appt. i emailed LM to schedule  Her a genetic...td

## 2013-05-23 ENCOUNTER — Telehealth: Payer: Self-pay | Admitting: *Deleted

## 2013-05-23 NOTE — Telephone Encounter (Signed)
Received a request to schedule a genetic appt.  Called and left a message for the pt to return my call so I can get her scheduled.

## 2013-07-03 ENCOUNTER — Telehealth: Payer: Self-pay | Admitting: *Deleted

## 2013-07-03 NOTE — Telephone Encounter (Signed)
Left message for pt to return my call so I can schedule her for genetics.  

## 2013-07-03 NOTE — Telephone Encounter (Signed)
Pt returned my call & I confirmed 08/28/13 genetic appt w/ pt.  Mailed calendar to pt.

## 2013-07-25 ENCOUNTER — Telehealth: Payer: Self-pay | Admitting: *Deleted

## 2013-07-25 NOTE — Telephone Encounter (Signed)
Pt called requesting to start taking Arimidex since she in unable to take Tamoxifen.  Discussed case with Dr. Welton Flakes.  Per Dr. Welton Flakes pt will need to have [facility] and estriol drawn prior to ensure she is post menopausal.  Informed pt.  Received verbal understanding.  Pt is to see her PCP 07/26/13.  She is planning on having them drawn then and have them faxed to [place].  Contact information given.

## 2013-08-01 ENCOUNTER — Ambulatory Visit (INDEPENDENT_AMBULATORY_CARE_PROVIDER_SITE_OTHER): Payer: Medicare (Managed Care) | Admitting: General Surgery

## 2013-08-07 ENCOUNTER — Telehealth: Payer: Self-pay | Admitting: *Deleted

## 2013-08-07 NOTE — Telephone Encounter (Signed)
Pt called to cancel her genetics appt.  She will r/s at another time.  She is concerned about medical bills.  I asked if I could give her number to financial counselors.  She denied that.  She said they couldn't help her.  Gave her appt with Dr. Welton Flakes in Jan 2014.  Gave pt emotional support.  Encourage pt to call with further needs.  Received verbal understanding.

## 2013-08-10 ENCOUNTER — Encounter (INDEPENDENT_AMBULATORY_CARE_PROVIDER_SITE_OTHER): Payer: Self-pay | Admitting: General Surgery

## 2013-08-28 ENCOUNTER — Other Ambulatory Visit: Payer: Medicare (Managed Care) | Admitting: Lab

## 2013-08-28 ENCOUNTER — Encounter: Payer: Medicare (Managed Care) | Admitting: Genetic Counselor

## 2013-09-14 ENCOUNTER — Other Ambulatory Visit: Payer: Self-pay | Admitting: *Deleted

## 2013-09-19 ENCOUNTER — Telehealth: Payer: Self-pay | Admitting: Oncology

## 2013-09-20 ENCOUNTER — Other Ambulatory Visit: Payer: Medicare (Managed Care)

## 2013-09-20 ENCOUNTER — Ambulatory Visit: Payer: Medicare (Managed Care) | Admitting: Oncology

## 2013-10-13 ENCOUNTER — Telehealth: Payer: Self-pay | Admitting: Oncology

## 2013-11-24 ENCOUNTER — Ambulatory Visit: Payer: Medicare (Managed Care) | Admitting: Oncology

## 2013-11-24 ENCOUNTER — Other Ambulatory Visit: Payer: Medicare (Managed Care) | Admitting: Lab

## 2013-12-15 ENCOUNTER — Other Ambulatory Visit: Payer: Medicare (Managed Care)

## 2013-12-15 ENCOUNTER — Ambulatory Visit: Payer: Medicare (Managed Care) | Admitting: Oncology

## 2013-12-15 ENCOUNTER — Telehealth: Payer: Self-pay | Admitting: *Deleted

## 2013-12-15 NOTE — Telephone Encounter (Signed)
Pt called stating she was feeling a little "light headed" d/t a change in her BP medication and that it has been sleeting where she lives and she needs to change her appt.  I r/s her appt to 12/18/13.  Confirmed new appt date and time.  Pt co/o soreness around her lumpectomy site and incision.  Informed pt that she should get an appt with Dr. Dalbert Batman.  I will send Dr. Darrel Hoover nurse an appt request for f/u.  Pt denies further needs at this time.  Encourage pt to call with questions.  Received verbal understanding.

## 2013-12-18 ENCOUNTER — Ambulatory Visit: Payer: Medicare (Managed Care) | Admitting: Oncology

## 2013-12-18 ENCOUNTER — Telehealth (INDEPENDENT_AMBULATORY_CARE_PROVIDER_SITE_OTHER): Payer: Self-pay

## 2013-12-18 ENCOUNTER — Other Ambulatory Visit: Payer: Medicare (Managed Care)

## 2013-12-18 NOTE — Telephone Encounter (Signed)
I called pt to schedule an appointment to see Dr Dalbert Batman.  Per Bary Castilla, RN she c/o soreness around her incision.  I was going to ask her if she can come in now since Dr Dalbert Batman is here in the office and available.  Await call back.

## 2013-12-19 ENCOUNTER — Encounter (INDEPENDENT_AMBULATORY_CARE_PROVIDER_SITE_OTHER): Payer: Self-pay | Admitting: General Surgery

## 2013-12-19 ENCOUNTER — Ambulatory Visit (INDEPENDENT_AMBULATORY_CARE_PROVIDER_SITE_OTHER): Payer: Medicare (Managed Care) | Admitting: General Surgery

## 2013-12-19 VITALS — BP 154/82 | HR 80 | Resp 16 | Ht 63.0 in | Wt 173.0 lb

## 2013-12-19 DIAGNOSIS — C50419 Malignant neoplasm of upper-outer quadrant of unspecified female breast: Secondary | ICD-10-CM

## 2013-12-19 NOTE — Progress Notes (Signed)
Patient ID: Angela Boyer, female   DOB: Dec 28, 1960, 53 y.o.   MRN: [id number]  History:  This patient underwent right partial mastectomy and sentinel node biopsy on 05/04/2012.  Final pathology showed ductal carcinoma in situ, receptor positive. Good margins. Final stage Tis, N0. She had adjuvant radiation therapy.  She was on tamoxifen initially but that was discontinued  Last mammograms were 04/07/2013, category 2, expected postlumpectomy scarring.  Hurley complaint is tenderness to touch in the lateral inferior right breast. He does have chronic pain just tender to touch or to lie  her right side.  Past history, family history, social history, and review of systems are documented on the chart, unchanged, and noncontributory except as described above. She does have a history of a brain tumor. She does have a history of being on gabapentin for chronic pain management for "burning tongue syndrome."   Exam:  Patient looks fine. A little bit anxious chronically. In no distress  Neck reveals no adenopathy mass or jugular venous distention  Lungs are clear to auscultation bilaterally  Heart regular rate and rhythm. No ectopy.  Breast right breast reveals well healed lumpectomy scar superior lateral circumareolar area well-healed axillary scar. Skin is healthy. No mass. No axillary adenopathy. No abnormality other than tenderness.Left breast exam is unremarkable.    Assessment:  Ductal carcinoma in situ right breast, no evidence of recurrent 6 months following right partial mastectomy and sentinel node biopsy  Tenderness below lumpectomy site. Suspect some type of neuritis secondary to surgery and radiation therapy. No evidence of recurrent disease   Plan: She was reassured  Continue Regular followup with Dr. Marcy Panning Repeat bilateral mammograms in May 2015 Return to see me in one year. Bilateral mammograms May 2015 Return to see me in June 2014.     Edsel Petrin. Dalbert Batman, M.D.,  [facility]  General and Minimally invasive Surgery  Breast and Colorectal Surgery  Office: [phone]  Pager: [phone]

## 2014-02-01 ENCOUNTER — Telehealth: Payer: Self-pay | Admitting: *Deleted

## 2014-02-01 NOTE — Telephone Encounter (Signed)
Pt called to schedule f/u with Dr. Humphrey Rolls.  Confirmed appt on 02/09/14 at 1100 for lab and 1130 to see Dr. Humphrey Rolls.  Pt teary with external issues.  Gave emotional support.  By end of conversation pt calm.

## 2014-02-09 ENCOUNTER — Ambulatory Visit: Payer: Medicare (Managed Care)

## 2014-02-09 ENCOUNTER — Telehealth: Payer: Self-pay | Admitting: Hematology and Oncology

## 2014-02-09 ENCOUNTER — Other Ambulatory Visit: Payer: Medicare (Managed Care)

## 2014-02-09 NOTE — Telephone Encounter (Signed)
, °

## 2014-02-14 ENCOUNTER — Other Ambulatory Visit (HOSPITAL_BASED_OUTPATIENT_CLINIC_OR_DEPARTMENT_OTHER): Payer: Medicare (Managed Care)

## 2014-02-14 ENCOUNTER — Encounter: Payer: Self-pay | Admitting: Hematology and Oncology

## 2014-02-14 ENCOUNTER — Telehealth: Payer: Self-pay | Admitting: Oncology

## 2014-02-14 ENCOUNTER — Ambulatory Visit (HOSPITAL_BASED_OUTPATIENT_CLINIC_OR_DEPARTMENT_OTHER): Payer: Medicare (Managed Care) | Admitting: Hematology and Oncology

## 2014-02-14 VITALS — BP 129/80 | HR 57 | Temp 98.3°F | Resp 18 | Ht 63.0 in | Wt 176.5 lb

## 2014-02-14 DIAGNOSIS — R635 Abnormal weight gain: Secondary | ICD-10-CM

## 2014-02-14 DIAGNOSIS — Z170 Estrogen receptor positive status [ER+]: Secondary | ICD-10-CM

## 2014-02-14 DIAGNOSIS — C50419 Malignant neoplasm of upper-outer quadrant of unspecified female breast: Secondary | ICD-10-CM

## 2014-02-14 DIAGNOSIS — R5383 Other fatigue: Secondary | ICD-10-CM

## 2014-02-14 DIAGNOSIS — R748 Abnormal levels of other serum enzymes: Secondary | ICD-10-CM

## 2014-02-14 DIAGNOSIS — D0590 Unspecified type of carcinoma in situ of unspecified breast: Secondary | ICD-10-CM

## 2014-02-14 DIAGNOSIS — R5381 Other malaise: Secondary | ICD-10-CM

## 2014-02-14 DIAGNOSIS — C50411 Malignant neoplasm of upper-outer quadrant of right female breast: Secondary | ICD-10-CM

## 2014-02-14 LAB — CBC WITH DIFFERENTIAL/PLATELET
BASO%: 0.4 % (ref 0.0–2.0)
BASOS ABS: 0 10*3/uL (ref 0.0–0.1)
EOS ABS: 0.2 10*3/uL (ref 0.0–0.5)
EOS%: 1.8 % (ref 0.0–7.0)
HCT: 42.8 % (ref 34.8–46.6)
HEMOGLOBIN: 14.1 g/dL (ref 11.6–15.9)
LYMPH#: 2 10*3/uL (ref 0.9–3.3)
LYMPH%: 24.1 % (ref 14.0–49.7)
MCH: 31.5 pg (ref 25.1–34.0)
MCHC: 32.8 g/dL (ref 31.5–36.0)
MCV: 95.8 fL (ref 79.5–101.0)
MONO#: 0.6 10*3/uL (ref 0.1–0.9)
MONO%: 7.1 % (ref 0.0–14.0)
NEUT%: 66.6 % (ref 38.4–76.8)
NEUTROS ABS: 5.5 10*3/uL (ref 1.5–6.5)
Platelets: 222 10*3/uL (ref 145–400)
RBC: 4.47 10*6/uL (ref 3.70–5.45)
RDW: 13.9 % (ref 11.2–14.5)
WBC: 8.3 10*3/uL (ref 3.9–10.3)

## 2014-02-14 LAB — COMPREHENSIVE METABOLIC PANEL (CC13)
ALBUMIN: 3.6 g/dL (ref 3.5–5.0)
ALT: 51 U/L (ref 0–55)
AST: 39 U/L — AB (ref 5–34)
Alkaline Phosphatase: 99 U/L (ref 40–150)
Anion Gap: 10 mEq/L (ref 3–11)
BUN: 14.3 mg/dL (ref 7.0–26.0)
CALCIUM: 9.4 mg/dL (ref 8.4–10.4)
CHLORIDE: 108 meq/L (ref 98–109)
CO2: 27 mEq/L (ref 22–29)
Creatinine: 1 mg/dL (ref 0.6–1.1)
GLUCOSE: 89 mg/dL (ref 70–140)
POTASSIUM: 4.2 meq/L (ref 3.5–5.1)
SODIUM: 146 meq/L — AB (ref 136–145)
TOTAL PROTEIN: 6.8 g/dL (ref 6.4–8.3)
Total Bilirubin: 0.59 mg/dL (ref 0.20–1.20)

## 2014-02-14 NOTE — Progress Notes (Signed)
. [name]   DOB: October 09, 1961  MR#: [id number]  CSN#:[id number]  HISTORY OF PRESENT ILLNESS: The patient (goes by "Angela Boyer") had bilateral screening mammography 0502/2013 showing microcalcifications in the right breast. Additional views were obtained of 04/06/2012 and this confirmed a 7 mm cluster of pleomorphic calcifications in the upper outer quadrant of the right breast. Biopsy of the mass was performed the same day, and showed ([id number]) ductal carcinoma in situ, high-grade, estrogen and progesterone receptor both positive at 100%.  The patient proceeded to bilateral breast MRI 04/12/2012 showing only post biopsy change, with no masses or abnormal enhancement suspicious for malignancy in either breast and no adenopathy. Her subsequent history is as detailed below.  INTERVAL HISTORY: Patient is a 53 year old female who now returns for followup.She is not taking the Tamoxifen since she could not tolerate it. She also discontinued Effexor 5 months ago,hot flashes steady. She was seen by a gynecologist  and had PAP smear reportedly normal. She does have a little bit of right breast tenderness at the surgical site which seen by Dr.Ingram and reassured her is normal.  Mainly she reports extreme fatigue.She cannot do anything and falls asleep.She started taking energy drinks over the past 2 weeks for that reason. Weight is up by 25 pounds she tells me over the past year. She denies bone pain or cardiac arrythmia. Her medications have been adjusted,before on 5 BP meds now on 2,Benazepril and Carvediol. She was diagnosed with burning tongue syndrome after partial hysterectomy at [facility].She takes Neurontin for that reason and has decreased  to 600 mg twice daily. She only takes [place] 4-5 times per month.She takes Ativan only once per month for ? Panic attack. She was started on Vit D 2000 IU daily 2 weeks ago.    Remainder of the 10 point review of systems is negative   PAST  MEDICAL HISTORY: Past Medical History  Diagnosis Date  . Colitis   . Ischemic colitis, enteritis, or enterocolitis   . Hypertension   . GERD (gastroesophageal reflux disease)   . Anxiety   . H/O colonoscopy 2012  . Night sweats   . Wears glasses   . Depression   . Hot flashes   . Burning tongue syndrome   . Breast cancer 05/04/12    right breast lumpectomy=Ductal carcinoma in situw/comedo necrosis&calcifications,ER?PR=positivedx 04/06/12  . Breast cancer   . Breast cancer     RT diagnosed this year   chronic pain syndrome, the patient receiving all pain medicines through a pain clinic in [place].  PAST SURGICAL HISTORY: Past Surgical History  Procedure Laterality Date  . Diagnostic laparoscopy      ischemic colitis  . Colon surgery      partial colectomy during exploratory surg  . Brain surgery  2001    clips 2 cerebral aneuis  . Breast lumpectomy  05/04/12    right breast  . Breast biopsy  04/06/12    right breast bx 11 0'clock  . Abdominal hysterectomy  2009    ovaries and tubes intact  . Appendectomy      30 years ago  . Cholecystectomy  2012   she had a ruptured of central nervous system aneurysm approximately 12 years ago, which resulted on her being disabled. She did not have her ovaries removed at the time of her hysterectomy.  FAMILY HISTORY Family History  Problem Relation Age of Onset  . Breast cancer Mother   . Cancer Mother  breast  . Hypertension Mother   . Melanoma Father   . Cancer Father     melanoma  . Diabetes Father   . Hypertension Father   . Hypertension Brother    The patient's parents are alive. Her mother was diagnosed with breast cancer at the age of 49. She underwent left mastectomy and is doing well currently age 52. The patient's father had what sounds like squamous cell cancers of the face secondary to sun exposure, clearly not melanoma. The patient has one brother, no sisters. There are no other cancers in the immediate  family.  GYNECOLOGIC HISTORY: Menarche age 59, [place] P78, with first live birth at age 17. She status post hysterectomy without salpingo-oophorectomy. She took hormone replacement for about 4 months, stopping April 2013.  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 Her husband Angela Boyer (goes by Angela Boyer) is a Occupational hygienist. Daughter Angela Boyer lives in [place] and owns a Education administrator business. Daughter Angela. Boyer lives in [place] at and works at a group home for local children. Son Angela Boyer lives in [place] and workes for the department of [place]. The patient has 2 grandchildren. She is not a church attender  ADVANCED DIRECTIVES: Not in place  HEALTH MAINTENANCE: History  Substance Use Topics  . Smoking status: Former Smoker -- .5 years    Types: Cigarettes    Quit date: 05/19/1992  . Smokeless tobacco: Never Used  . Alcohol Use: No     Colonoscopy: 2011/[facility]  PAP: s/p hysterectomy  Bone density: never  Lipid panel:  Allergies  Allergen Reactions  . Tape     Thin skin   . Vicodin [Hydrocodone-Acetaminophen] Nausea And Vomiting    Current Outpatient Prescriptions  Medication Sig Dispense Refill  . atorvastatin (LIPITOR) 10 MG tablet Take 10 mg by mouth daily.      . benazepril (LOTENSIN) 20 MG tablet Take 20 mg by mouth 2 (two) times daily.       . carvedilol (COREG) 25 MG tablet Take 25 mg by mouth 2 (two) times daily with a meal.      . cholecalciferol (VITAMIN D) 1000 UNITS tablet Take 1,000 Units by mouth 2 (two) times daily.      . clonazePAM (KLONOPIN) 0.5 MG tablet Take 0.5 mg by mouth daily.       Marland Kitchen gabapentin (NEURONTIN) 600 MG tablet Take 600 mg by mouth 2 (two) times daily.        Marland Kitchen LORazepam (ATIVAN) 0.5 MG tablet Take 0.5 mg by mouth as needed. Panic Attacks      . omeprazole (PRILOSEC) 40 MG capsule Take 40 mg by mouth daily.        Marland Kitchen oxyCODONE-acetaminophen  (PERCOCET) 7.5-325 MG per tablet Take 1 tablet by mouth every 4 (four) hours as needed.      Marland Kitchen amLODipine (NORVASC) 10 MG tablet Take 10 mg by mouth daily.        Marland Kitchen venlafaxine (EFFEXOR) 75 MG tablet Take 75 mg by mouth at bedtime as needed.      . zolpidem (AMBIEN) 10 MG tablet Take 10 mg by mouth as needed. Sleep       No current facility-administered medications for this visit.    OBJECTIVE: Middle-aged white woman  who appears comfortable and in no acute distress  Filed Vitals:   02/14/14 1455  BP: 129/80  Pulse: 57  Temp: 98.3 F (36.8 C)  Resp: 18     Body mass index is 31.27 kg/(m^2).    ECOG FS:0 Filed Weights   02/14/14 1455  Weight: 176 lb 8 oz (80.06 kg)   Sclerae unicteric Oropharynx clear No peripheral adenopathy Lungs no rales or rhonchi Heart regular rate and rhythm Abd soft, nontender; positive bowel sounds abdomen appears distended no tender MSK no focal spinal tenderness, no peripheral edema Neuro: nonfocal, alert and oriented x3 Breasts: The right breast is status post lumpectomy. Mild skin changes secondary to radiation therapy. No suspicious nodularity. No evidence of local recurrence. Axillae are benign bilaterally with no adenopathy    LAB RESULTS: Lab Results  Component Value Date   WBC 8.3 02/14/2014   NEUTROABS 5.5 02/14/2014   HGB 14.1 02/14/2014   HCT 42.8 02/14/2014   MCV 95.8 02/14/2014   PLT 222 02/14/2014      Chemistry      Component Value Date/Time   NA 146* 02/14/2014 1437   NA 140 08/02/2012 1535   K 4.2 02/14/2014 1437   K 4.0 08/02/2012 1535   CL 106 02/20/2013 1053   CL 104 08/02/2012 1535   CO2 27 02/14/2014 1437   CO2 28 08/02/2012 1535   BUN 14.3 02/14/2014 1437   BUN 15 08/02/2012 1535   CREATININE 1.0 02/14/2014 1437   CREATININE 0.69 08/02/2012 1535   CREATININE 0.74 06/16/2012 1115      Component Value Date/Time   CALCIUM 9.4 02/14/2014 1437   CALCIUM 9.1 08/02/2012 1535   ALKPHOS 99 02/14/2014 1437   ALKPHOS 112 08/02/2012 1535    AST 39* 02/14/2014 1437   AST 79* 08/02/2012 1535   ALT 51 02/14/2014 1437   ALT 99* 08/02/2012 1535   BILITOT 0.59 02/14/2014 1437   BILITOT 0.4 08/02/2012 1535         STUDIES:  No results found.   ASSESSMENT: 53 y.o.  [place] woman   (1)  status post right lumpectomy and sentinel node biopsy  05/04/2012 for a ductal carcinoma in situ, grade 3, strongly estrogen and progesterone receptor positive, measuring 1.5 cm, with negative and ample margins  (2)  Status post radiation therapy, completed in late August 2013  (3)  patient was begun on tamoxifen but she discontinued it on her own. Her original dose was 20 mg on a daily basis.Then was advised to take 10 mg daily but she could not tolerate either.She discontinued Tamoxifen and states feeling better.  (4) extreme fatigue   Progressive with weight gain(documented 16 pounds since 04/2012).I advised patient to stop the energy drinks she recently started taking.  (5) elevated liver enzymes attributed to  fatty liver.Had prior U/S abdomen and CT abdomen in 2013.(colitis)     PLAN:   #1   DCIS she is just being observed she is not taking the tamoxifen due to all of the side effects that she experiences.  Will check thyroid status with TSH.  Check ANA,Hepatitis B core antibody and Hepatitis surface antibody,Hepatitis C antibody. CPK,LDH,ESR,B12,Mg level.  Advised patient to further decrease Neurontin as might be contributing to her symptoms.  Follow up next week. Will request Vit D level from Primary MD.  Mammogram due 04/07/2014.    I spent 25 minutes counseling the patient face to face.  The total time spent in the appointment was 30 minutes.  Amada Kingfisher, M.D. Oncology/Hematology [place] [phone] (Office)  02/14/2014

## 2014-02-14 NOTE — Telephone Encounter (Signed)
gv pt appt schedule for march/april. pt will return to lab tomorrow - lab closed now.

## 2014-02-15 ENCOUNTER — Other Ambulatory Visit (HOSPITAL_BASED_OUTPATIENT_CLINIC_OR_DEPARTMENT_OTHER): Payer: Medicare (Managed Care)

## 2014-02-15 DIAGNOSIS — D0590 Unspecified type of carcinoma in situ of unspecified breast: Secondary | ICD-10-CM

## 2014-02-15 DIAGNOSIS — C50419 Malignant neoplasm of upper-outer quadrant of unspecified female breast: Secondary | ICD-10-CM

## 2014-02-15 DIAGNOSIS — R635 Abnormal weight gain: Secondary | ICD-10-CM

## 2014-02-15 DIAGNOSIS — R5383 Other fatigue: Secondary | ICD-10-CM

## 2014-02-15 LAB — TSH CHCC: TSH: 0.69 m[IU]/L (ref 0.308–3.960)

## 2014-02-15 LAB — MAGNESIUM (CC13): MAGNESIUM: 2 mg/dL (ref 1.5–2.5)

## 2014-02-15 LAB — LACTATE DEHYDROGENASE (CC13): LDH: 212 U/L (ref 125–245)

## 2014-02-16 LAB — VITAMIN B12: VITAMIN B 12: 375 pg/mL (ref 211–911)

## 2014-02-16 LAB — HEPATITIS B CORE ANTIBODY, TOTAL: Hep B Core Total Ab: NONREACTIVE

## 2014-02-16 LAB — HEPATITIS C ANTIBODY: HCV Ab: NEGATIVE

## 2014-02-16 LAB — CK: CK TOTAL: 42 U/L (ref 7–177)

## 2014-02-16 LAB — SEDIMENTATION RATE: Sed Rate: 4 mm/hr (ref 0–22)

## 2014-02-16 LAB — ANA: Anti Nuclear Antibody(ANA): NEGATIVE

## 2014-02-16 LAB — HEPATITIS B SURFACE ANTIBODY,QUALITATIVE: Hep B S Ab: NEGATIVE

## 2014-02-19 ENCOUNTER — Other Ambulatory Visit: Payer: Self-pay | Admitting: Oncology

## 2014-02-20 ENCOUNTER — Telehealth: Payer: Self-pay | Admitting: Oncology

## 2014-02-20 NOTE — Telephone Encounter (Signed)
, °

## 2014-02-21 ENCOUNTER — Other Ambulatory Visit: Payer: Self-pay

## 2014-02-21 ENCOUNTER — Telehealth: Payer: Self-pay

## 2014-02-21 ENCOUNTER — Ambulatory Visit: Payer: Medicare (Managed Care)

## 2014-02-21 NOTE — Telephone Encounter (Signed)
LMOVM - all lab results were normal.  Call clinic if any questions.

## 2014-02-21 NOTE — Telephone Encounter (Signed)
See note from [place] below.  Routed [place]

## 2014-02-21 NOTE — Telephone Encounter (Signed)
Message copied by Prentiss Bells on Wed Feb 21, 2014  2:59 PM ------      Message from: HEFFELFINGER, JEFFREY L      Created: Wed Feb 21, 2014  2:14 PM      Regarding: Patient anxious to receive lab results      Contact: [phone]       [place] received a call from this patient indicating that she is quite anxious to receive the lab results from her visit with Dr. Owens Loffler, on 02/14/14.  Please call patient with results right away.  ------

## 2014-02-21 NOTE — Telephone Encounter (Signed)
Let patient know that all of her tests on 3/26 were normal

## 2014-02-23 ENCOUNTER — Telehealth: Payer: Self-pay | Admitting: *Deleted

## 2014-02-23 NOTE — Telephone Encounter (Signed)
Pt called c/o right side pain and swelling that travels around to her back that starts beside her right breast.  Pt request to see Dr. Dalbert Batman for assessment.  Asked pt if area was red, hot or hard.  Pt relay it is soft and normal appearing. She has had this problem for 1 month and it is worse.  Pt reports being afebrile.  Pt relay she has not done any extra lifting or moving out of the norm.  Sent Dr. Dalbert Batman and his nurse pt complaint and request to be seen.  Discussed pt normal lab results with her.

## 2014-02-26 ENCOUNTER — Telehealth (INDEPENDENT_AMBULATORY_CARE_PROVIDER_SITE_OTHER): Payer: Self-pay

## 2014-02-26 NOTE — Telephone Encounter (Signed)
Message copied by Dois Davenport on Mon Feb 26, 2014  9:14 AM ------      Message from: Adin Hector      Created: Fri Feb 23, 2014  6:27 PM      Regarding: FW: pain and swelling       Please figure out how we can see patient Mon/Tues/Wed            hmi      ----- Message -----         From: Lew Dawes, RN         Sent: 02/23/2014  11:27 AM           To: Adin Hector, MD, Dois Davenport, LPN      Subject: pain and swelling                                        Hi Dr. Dalbert Batman,            Mrs. Pretlow called to c/o pain and swelling to the right side of breast that travels around the back.  She stated it started a month ago and has gotten worse.  She would like to see you on Mon or Tues if possible.  I also recommended PT with [facility] if it is lymph fluid that needs some manual drainage.  She stated she'll "do whatever Dr. Dalbert Batman wants me to do".              Thanks,      [employer]       ------

## 2014-02-26 NOTE — Telephone Encounter (Signed)
Per Dr Darrel Hoover request I have made appt for pt to be seen 02-27-14. LMOM for pt with date and time.

## 2014-02-27 ENCOUNTER — Encounter (INDEPENDENT_AMBULATORY_CARE_PROVIDER_SITE_OTHER): Payer: Medicare (Managed Care) | Admitting: General Surgery

## 2014-03-01 ENCOUNTER — Encounter (INDEPENDENT_AMBULATORY_CARE_PROVIDER_SITE_OTHER): Payer: Self-pay | Admitting: General Surgery

## 2014-05-15 ENCOUNTER — Telehealth: Payer: Self-pay | Admitting: Oncology

## 2014-05-28 ENCOUNTER — Ambulatory Visit: Payer: Medicare (Managed Care) | Admitting: Oncology

## 2014-06-07 ENCOUNTER — Encounter: Payer: Self-pay | Admitting: *Deleted

## 2014-06-07 ENCOUNTER — Other Ambulatory Visit: Payer: Self-pay | Admitting: Adult Health

## 2014-06-07 DIAGNOSIS — Z853 Personal history of malignant neoplasm of breast: Secondary | ICD-10-CM

## 2014-06-07 NOTE — Progress Notes (Signed)
Pt called requesting appt for mammo. Received appt for 7/23 at 1:45 from [facility]. Informed and confirmed mammo appt. Pt denies further needs or concerns at this time. Confirmed appt with Dr. Lindi Adie as well on 8/17.

## 2014-06-14 ENCOUNTER — Ambulatory Visit
Admission: RE | Admit: 2014-06-14 | Discharge: 2014-06-14 | Disposition: A | Discharge status: Home / Self Care | Payer: Medicare (Managed Care) | Source: Ambulatory Visit | Attending: Adult Health | Admitting: Adult Health

## 2014-06-14 DIAGNOSIS — Z853 Personal history of malignant neoplasm of breast: Secondary | ICD-10-CM

## 2014-07-09 ENCOUNTER — Ambulatory Visit: Payer: Medicare (Managed Care) | Admitting: Hematology and Oncology

## 2014-07-18 ENCOUNTER — Other Ambulatory Visit: Payer: Self-pay | Admitting: *Deleted

## 2014-07-19 ENCOUNTER — Telehealth: Payer: Self-pay | Admitting: Hematology and Oncology

## 2014-08-02 ENCOUNTER — Telehealth: Payer: Self-pay | Admitting: *Deleted

## 2014-08-02 NOTE — Telephone Encounter (Signed)
Confirmed f/u appt with Dr. Lindi Adie on 08/09/14. Pt relate she is doing well, although fatigued. She is hoping Dr. Lindi Adie will provide some guidance to help with the fatigue. Encourage pt to call with further needs.

## 2014-08-07 ENCOUNTER — Telehealth: Payer: Self-pay | Admitting: *Deleted

## 2014-08-07 NOTE — Telephone Encounter (Signed)
Return pt call to discuss intimacy and healthy pelvic floor class on 08/20/14. Also confirmed 08/09/14 appt with Dr. Lindi Adie. Pt continue to have fatigue and hopeful that Dr. Lindi Adie is able to assist with her symptoms. Denies further needs. Encourage pt to call with questions.

## 2014-08-09 ENCOUNTER — Telehealth: Payer: Self-pay | Admitting: *Deleted

## 2014-08-09 ENCOUNTER — Ambulatory Visit: Payer: Medicare (Managed Care) | Admitting: Hematology and Oncology

## 2014-08-09 NOTE — Telephone Encounter (Signed)
Pt called to cancel appt with Dr. Lindi Adie. She would like a med onc in [place]. She will call me with med onc to send her records to.

## 2014-08-15 ENCOUNTER — Telehealth: Payer: Self-pay | Admitting: *Deleted

## 2014-08-15 NOTE — Telephone Encounter (Signed)
Left vm for pt to return call concerning med onc in [facility]. Pt spends the majority of her time in [place] and requested to transfer care there.

## 2014-08-28 ENCOUNTER — Telehealth: Payer: Self-pay | Admitting: *Deleted

## 2014-08-28 NOTE — Telephone Encounter (Signed)
Pt called to state she has decided to keep her treatment and follow up appt in [place]. Scheduled pt to see Dr. Lindi Adie on 08/30/14 at 1130 with labs afterwards. Pt denies further needs at this time. Encourage pt to call with questions. Received verbal understanding.

## 2014-08-30 ENCOUNTER — Ambulatory Visit: Payer: Medicare (Managed Care) | Admitting: Hematology and Oncology

## 2014-08-30 ENCOUNTER — Other Ambulatory Visit: Payer: Medicare (Managed Care)

## 2014-09-24 ENCOUNTER — Encounter: Payer: Self-pay | Admitting: Hematology and Oncology

## 2014-10-09 ENCOUNTER — Telehealth: Payer: Self-pay | Admitting: *Deleted

## 2014-10-09 NOTE — Telephone Encounter (Signed)
Pt called with c/o of pain to right lumpectomy bed and left breast tail tenderness. No lumps are palpable at this time per pt. Pt has had bilateral mammogram on 06/14/14 that was benign. I informed pt she should probably start by seeing Dr. Dalbert Batman. Pt request to see Dr. Lindi Adie first. Scheduled and confirmed appt with Dr. Lindi Adie on 10/10/14 at 3:30PM. Pt denies further needs.

## 2014-10-10 ENCOUNTER — Ambulatory Visit: Payer: Medicare (Managed Care) | Admitting: Hematology and Oncology

## 2016-05-06 ENCOUNTER — Telehealth: Payer: Self-pay | Admitting: *Deleted

## 2016-05-06 NOTE — Telephone Encounter (Signed)
Pt called wishing to come back to [facility] for care. Request pt that she sign a release at her current medical oncologist and imaging center to send MR and imaging reports. Informed once I have records I will get her scheduled with breast medical oncologist. Denies further needs at this time. Encourage pt to call with questions.
